# Patient Record
Sex: Male | Born: 1980
Health system: Southern US, Community
[De-identification: ages and names within clinical notes are randomized; demographics above are authoritative.]

## PROBLEM LIST (undated history)

## (undated) DIAGNOSIS — M5136 Other intervertebral disc degeneration, lumbar region: Secondary | ICD-10-CM

## (undated) DIAGNOSIS — F419 Anxiety disorder, unspecified: Secondary | ICD-10-CM

## (undated) DIAGNOSIS — M51369 Other intervertebral disc degeneration, lumbar region without mention of lumbar back pain or lower extremity pain: Secondary | ICD-10-CM

## (undated) DIAGNOSIS — Z8489 Family history of other specified conditions: Secondary | ICD-10-CM

## (undated) DIAGNOSIS — K219 Gastro-esophageal reflux disease without esophagitis: Secondary | ICD-10-CM

## (undated) DIAGNOSIS — F32A Depression, unspecified: Secondary | ICD-10-CM

## (undated) DIAGNOSIS — K635 Polyp of colon: Secondary | ICD-10-CM

## (undated) DIAGNOSIS — K5900 Constipation, unspecified: Secondary | ICD-10-CM

## (undated) HISTORY — PX: NO PAST SURGERIES: SHX2092

---

## 2009-09-12 ENCOUNTER — Ambulatory Visit: Payer: Self-pay | Admitting: General Practice

## 2011-01-18 ENCOUNTER — Ambulatory Visit: Payer: Self-pay | Admitting: Internal Medicine

## 2013-04-13 ENCOUNTER — Ambulatory Visit: Payer: Self-pay | Admitting: General Practice

## 2014-04-11 ENCOUNTER — Ambulatory Visit: Payer: Self-pay | Admitting: Physician Assistant

## 2014-04-11 LAB — URINALYSIS, COMPLETE
BACTERIA: NEGATIVE
BILIRUBIN, UR: NEGATIVE
BLOOD: NEGATIVE
Glucose,UR: NEGATIVE mg/dL (ref 0–75)
Ketone: NEGATIVE
Leukocyte Esterase: NEGATIVE
Nitrite: NEGATIVE
Ph: 6 (ref 4.5–8.0)
Protein: NEGATIVE
RBC,UR: NONE SEEN /HPF (ref 0–5)
SPECIFIC GRAVITY: 1.01 (ref 1.003–1.030)
SQUAMOUS EPITHELIAL: NONE SEEN

## 2015-02-15 ENCOUNTER — Ambulatory Visit: Payer: Self-pay | Admitting: Physician Assistant

## 2015-03-21 ENCOUNTER — Other Ambulatory Visit
Admit: 2015-03-21 | Disposition: A | Discharge status: Home / Self Care | Payer: Self-pay | Attending: Ophthalmology | Admitting: Ophthalmology

## 2015-03-24 LAB — AEROBIC CULTURE

## 2015-04-11 LAB — CULTURE, FUNGUS WITHOUT SMEAR

## 2015-05-10 ENCOUNTER — Other Ambulatory Visit: Payer: Self-pay | Admitting: Family Medicine

## 2015-05-10 DIAGNOSIS — F17210 Nicotine dependence, cigarettes, uncomplicated: Secondary | ICD-10-CM

## 2015-05-10 DIAGNOSIS — H538 Other visual disturbances: Secondary | ICD-10-CM

## 2015-05-10 DIAGNOSIS — Z0000 Encounter for general adult medical examination without abnormal findings: Secondary | ICD-10-CM

## 2016-05-10 ENCOUNTER — Other Ambulatory Visit (HOSPITAL_COMMUNITY): Payer: Self-pay | Admitting: Emergency Medicine

## 2017-11-17 ENCOUNTER — Emergency Department: Payer: Self-pay

## 2017-11-17 ENCOUNTER — Encounter: Payer: Self-pay | Admitting: Emergency Medicine

## 2017-11-17 ENCOUNTER — Other Ambulatory Visit: Payer: Self-pay

## 2017-11-17 ENCOUNTER — Emergency Department
Admission: EM | Admit: 2017-11-17 | Discharge: 2017-11-17 | Disposition: A | Discharge status: Home / Self Care | Payer: Self-pay | Attending: Student in an Organized Health Care Education/Training Program | Admitting: Student in an Organized Health Care Education/Training Program

## 2017-11-17 DIAGNOSIS — F17210 Nicotine dependence, cigarettes, uncomplicated: Secondary | ICD-10-CM | POA: Insufficient documentation

## 2017-11-17 DIAGNOSIS — K219 Gastro-esophageal reflux disease without esophagitis: Secondary | ICD-10-CM | POA: Insufficient documentation

## 2017-11-17 DIAGNOSIS — R079 Chest pain, unspecified: Secondary | ICD-10-CM | POA: Insufficient documentation

## 2017-11-17 LAB — BASIC METABOLIC PANEL
Anion gap: 13 (ref 5–15)
BUN: 11 mg/dL (ref 6–20)
CALCIUM: 9.6 mg/dL (ref 8.9–10.3)
CO2: 24 mmol/L (ref 22–32)
Chloride: 100 mmol/L — ABNORMAL LOW (ref 101–111)
Creatinine, Ser: 0.98 mg/dL (ref 0.61–1.24)
GFR calc Af Amer: >60 mL/min (ref >60)
GLUCOSE: 87 mg/dL (ref 65–99)
Potassium: 4.1 mmol/L (ref 3.5–5.1)
Sodium: 137 mmol/L (ref 135–145)

## 2017-11-17 LAB — CBC
HCT: 47 % (ref 40.0–52.0)
HEMOGLOBIN: 15.9 g/dL (ref 13.0–18.0)
MCH: 30.4 pg (ref 26.0–34.0)
MCHC: 33.9 g/dL (ref 32.0–36.0)
MCV: 89.9 fL (ref 80.0–100.0)
Platelets: 207 10*3/uL (ref 150–440)
RBC: 5.23 MIL/uL (ref 4.40–5.90)
RDW: 12.8 % (ref 11.5–14.5)
WBC: 6.3 10*3/uL (ref 3.8–10.6)

## 2017-11-17 LAB — TROPONIN I

## 2017-11-17 MED ORDER — PANTOPRAZOLE SODIUM 40 MG PO TBEC: 40.0000 mg | DELAYED_RELEASE_TABLET | Freq: Every day | ORAL | 0 refills | Status: DC

## 2017-11-17 MED ORDER — PROMETHAZINE HCL 12.5 MG PO TABS: 12.5000 mg | ORAL_TABLET | Freq: Four times a day (QID) | ORAL | 0 refills | Status: DC | PRN

## 2017-11-17 NOTE — ED Notes (Signed)
Pt states over last several months he has experienced indigestion, and has been taking Nexium, with some relief initially. Patient also states he feels like he has recent lost weight bc he has been unable to eat. Pt denies any nausea or vomiting, just feels like when he tries to eat after a couple bites he just cant swallow. Pt currently reporting chest pain and a numbness and tingling in left arm. Pain in arm starts behind shoulder blade and travels down arm to behind ring finger. Pt currently in no obvious distress.

## 2017-11-17 NOTE — ED Provider Notes (Signed)
[facility] Emergency Department Provider Note    First MD Initiated Contact with Patient 11/17/17 1330     (approximate)  I have reviewed the triage vital signs and the nursing notes.   HISTORY  Chief Complaint Chest Pain    HPI Alex Mayer is a 36 y.o. male presents with chief complaint of 2 months of epigastric discomfort and burning in his chest now complaining of weight loss, persistent nausea and decreased oral intake with episodes of chest pain for the past 2 months with intermittent pain and tingling radiating down his left arm.  Patient has not had any recent hospitalizations.  He does smoke cigarettes but has decreased this.  He is tried limiting gluten as well as dairy.  Thinks that he has noted some improvement after gluten removal.  He has been taking Nexium with some improvement in his symptoms.  States that he is not having full bowel movements but is still passing gas.  States that he did get some improvement with the gas by taking Gas-X.  Denies any cough.  No shortness of breath.  No fevers.  No family history of inflammatory bowel disease.  No recent antibiotics.  No recent foreign travel.  No blood in his stools.  History reviewed. No pertinent past medical history. History reviewed. No pertinent family history. History reviewed. No pertinent surgical history. There are no active problems to display for this patient.     Prior to Admission medications   Medication Sig Start Date End Date Taking? Authorizing Provider  pantoprazole (PROTONIX) 40 MG tablet Take 1 tablet (40 mg total) by mouth daily. 11/17/17   Merlyn Lot, MD  promethazine (PHENERGAN) 12.5 MG tablet Take 1 tablet (12.5 mg total) by mouth every 6 (six) hours as needed for nausea or vomiting. 11/17/17   Merlyn Lot, MD    Allergies Patient has no known allergies.    Social History Social History   Tobacco Use  . Smoking status: Current Every Day  Smoker  . Smokeless tobacco: Never Used  Substance Use Topics  . Alcohol use: Not on file  . Drug use: Yes    Types: Marijuana    Review of Systems Patient denies headaches, rhinorrhea, blurry vision, numbness, shortness of breath, chest pain, edema, cough, abdominal pain, nausea, vomiting, diarrhea, dysuria, fevers, rashes or hallucinations unless otherwise stated above in HPI. ____________________________________________   PHYSICAL EXAM:  VITAL SIGNS: Vitals:   11/17/17 1132  BP: 115/71  Pulse: 89  Resp: 20  Temp: 97.7 F (36.5 C)  SpO2: 100%    Constitutional: Alert and oriented. Well appearing and in no acute distress. Eyes: Conjunctivae are normal.  Head: Atraumatic. Nose: No congestion/rhinnorhea. Mouth/Throat: Mucous membranes are moist.   Neck: No stridor. Painless ROM.  Cardiovascular: Normal rate, regular rhythm. Grossly normal heart sounds.  Good peripheral circulation. Respiratory: Normal respiratory effort.  No retractions. Lungs CTAB. Gastrointestinal: Soft and nontender. No distention. No abdominal bruits. No CVA tenderness. Genitourinary:  Musculoskeletal: No lower extremity tenderness nor edema.  No joint effusions. Neurologic:  Normal speech and language. No gross focal neurologic deficits are appreciated. No facial droop Skin:  Skin is warm, dry and intact. No rash noted. Psychiatric: Mood and affect are normal. Speech and behavior are normal.  ____________________________________________   LABS (all labs ordered are listed, but only abnormal results are displayed)  Results for orders placed or performed during the hospital encounter of 11/17/17 (from the past 24 hour(s))  Basic metabolic panel  Status: Abnormal   Collection Time: 11/17/17 11:30 AM  Result Value Ref Range   Sodium 137 135 - 145 mmol/L   Potassium 4.1 3.5 - 5.1 mmol/L   Chloride 100 (L) 101 - 111 mmol/L   CO2 24 22 - 32 mmol/L   Glucose, Bld 87 65 - 99 mg/dL   BUN 11 6 - 20  mg/dL   Creatinine, Ser 0.98 0.61 - 1.24 mg/dL   Calcium 9.6 8.9 - 10.3 mg/dL   GFR calc non Af Amer >60 >60 mL/min   GFR calc Af Amer >60 >60 mL/min   Anion gap 13 5 - 15  CBC     Status: None   Collection Time: 11/17/17 11:30 AM  Result Value Ref Range   WBC 6.3 3.8 - 10.6 K/uL   RBC 5.23 4.40 - 5.90 MIL/uL   Hemoglobin 15.9 13.0 - 18.0 g/dL   HCT 47.0 40.0 - 52.0 %   MCV 89.9 80.0 - 100.0 fL   MCH 30.4 26.0 - 34.0 pg   MCHC 33.9 32.0 - 36.0 g/dL   RDW 12.8 11.5 - 14.5 %   Platelets 207 150 - 440 K/uL  Troponin I     Status: None   Collection Time: 11/17/17 11:30 AM  Result Value Ref Range   Troponin I <0.03 <0.03 ng/mL   ____________________________________________  EKG My review and personal interpretation at Time: 11:31   Indication: chest pain  Rate: 80  Rhythm: sinus Axis: normal Other: non specific st changes, no stemi, normal intervals ____________________________________________  RADIOLOGY  I personally reviewed all radiographic images ordered to evaluate for the above acute complaints and reviewed radiology reports and findings.  These findings were personally discussed with the patient.  Please see medical record for radiology report.  ____________________________________________   PROCEDURES  Procedure(s) performed:  Procedures    Critical Care performed: no ____________________________________________   INITIAL IMPRESSION / ASSESSMENT AND PLAN / ED COURSE  Pertinent labs & imaging results that were available during my care of the patient were reviewed by me and considered in my medical decision making (see chart for details).  DDX: ACS, pericarditis, esophagitis, boerhaaves, pe, dissection, pna, bronchitis, costochondritis   Alex Mayer is a 36 y.o. who presents to the ED with 2 months of symptoms as described above.  He is afebrile and hemodynamically stable.  Abdominal exam is soft and benign.  He is in no respiratory distress.  Is  not clinically consistent with ACS.  EKG and troponin are negative.  Patient is low risk heart score of 2.  Not clinically consistent with pulmonary embolism.  Low risk by Wells score and is PERC negative.  No evidence of pneumonia.  Blood work is reassuring.  The patient's presentation and duration of symptoms do not feel that emergent CT imaging is clinically indicated.  Do suspect there is some component of gastritis, possibly gluten intolerance.  Will start patient on an antacid medication as well as anti-emetics.  Patient will be given referral to gastroenterology.  Have discussed with the patient and available family all diagnostics and treatments performed thus far and all questions were answered to the best of my ability. The patient demonstrates understanding and agreement with plan.       ____________________________________________   FINAL CLINICAL IMPRESSION(S) / ED DIAGNOSES  Final diagnoses:  Chest pain, unspecified type  Gastroesophageal reflux disease, esophagitis presence not specified      NEW MEDICATIONS STARTED DURING THIS VISIT:  This SmartLink is deprecated.  Use AVSMEDLIST instead to display the medication list for a patient.   Note:  This document was prepared using Dragon voice recognition software and may include unintentional dictation errors.    Merlyn Lot, MD 11/17/17 [phone]

## 2017-11-17 NOTE — Discharge Instructions (Signed)

## 2017-11-24 ENCOUNTER — Encounter: Payer: Self-pay | Admitting: Emergency Medicine

## 2017-11-24 ENCOUNTER — Emergency Department
Admission: EM | Admit: 2017-11-24 | Discharge: 2017-11-24 | Disposition: A | Discharge status: Home / Self Care | Payer: Self-pay | Attending: Emergency Medicine | Admitting: Emergency Medicine

## 2017-11-24 ENCOUNTER — Other Ambulatory Visit: Payer: Self-pay

## 2017-11-24 DIAGNOSIS — R61 Generalized hyperhidrosis: Secondary | ICD-10-CM | POA: Insufficient documentation

## 2017-11-24 DIAGNOSIS — R42 Dizziness and giddiness: Secondary | ICD-10-CM | POA: Insufficient documentation

## 2017-11-24 DIAGNOSIS — R55 Syncope and collapse: Secondary | ICD-10-CM | POA: Insufficient documentation

## 2017-11-24 DIAGNOSIS — F17200 Nicotine dependence, unspecified, uncomplicated: Secondary | ICD-10-CM | POA: Insufficient documentation

## 2017-11-24 DIAGNOSIS — Z79899 Other long term (current) drug therapy: Secondary | ICD-10-CM | POA: Insufficient documentation

## 2017-11-24 HISTORY — DX: Gastro-esophageal reflux disease without esophagitis: K21.9

## 2017-11-24 LAB — CBC
HCT: 43.9 % (ref 40.0–52.0)
Hemoglobin: 14.7 g/dL (ref 13.0–18.0)
MCH: 30.2 pg (ref 26.0–34.0)
MCHC: 33.6 g/dL (ref 32.0–36.0)
MCV: 90 fL (ref 80.0–100.0)
PLATELETS: 175 10*3/uL (ref 150–440)
RBC: 4.88 MIL/uL (ref 4.40–5.90)
RDW: 12.8 % (ref 11.5–14.5)
WBC: 4.9 10*3/uL (ref 3.8–10.6)

## 2017-11-24 LAB — COMPREHENSIVE METABOLIC PANEL
ALBUMIN: 4.7 g/dL (ref 3.5–5.0)
ALT: 14 U/L — AB (ref 17–63)
AST: 23 U/L (ref 15–41)
Alkaline Phosphatase: 58 U/L (ref 38–126)
Anion gap: 7 (ref 5–15)
BUN: 14 mg/dL (ref 6–20)
CHLORIDE: 102 mmol/L (ref 101–111)
CO2: 27 mmol/L (ref 22–32)
CREATININE: 0.93 mg/dL (ref 0.61–1.24)
Calcium: 9.4 mg/dL (ref 8.9–10.3)
GFR calc Af Amer: >60 mL/min (ref >60)
GLUCOSE: 148 mg/dL — AB (ref 65–99)
Potassium: 3.3 mmol/L — ABNORMAL LOW (ref 3.5–5.1)
SODIUM: 136 mmol/L (ref 135–145)
Total Bilirubin: 0.8 mg/dL (ref 0.3–1.2)
Total Protein: 7.6 g/dL (ref 6.5–8.1)

## 2017-11-24 LAB — GLUCOSE, CAPILLARY: Glucose-Capillary: 136 mg/dL — ABNORMAL HIGH (ref 65–99)

## 2017-11-24 LAB — LIPASE, BLOOD: LIPASE: 49 U/L (ref 11–51)

## 2017-11-24 MED ORDER — SUCRALFATE 1 G PO TABS: 1.0000 g | ORAL_TABLET | Freq: Three times a day (TID) | ORAL | 0 refills | Status: DC

## 2017-11-24 NOTE — ED Triage Notes (Signed)
sayswas atwork and he got shakey, diaphoretic and felt like passing out.skin warm and drynow. Awake and alert.ekgand fsbs done.sayshe has been  Having abd pain as well for weeks.

## 2017-11-24 NOTE — ED Notes (Signed)
Pt ambulatory to POV with wife. Steady gait. NAD, VSS, Resp non-labored and equal. No questions or concerns voiced during discharge.

## 2017-11-24 NOTE — ED Provider Notes (Signed)
[facility] Emergency Department Provider Note   ____________________________________________    I have reviewed the triage vital signs and the nursing notes.   HISTORY  Chief Complaint Near Syncope     HPI Alex Mayer is a 36 y.o. male who presents after a near syncopal episode while at work today.  Patient reports he had been on his feet for quite a while and began to feel lightheaded.  He decided to lean over on a machine because he felt that he might faint.  He report he broke out in sweats and became quite lightheaded and had tunnel vision.  His coworkers helped him down and he began to feel better soon thereafter.  He does report over the past month that he has had difficulty with what sounds like severe acid reflux and he has been adjusting his diet to cope with this.  He has an appointment with gastroenterology next week.  He denies chest pain, palpitations, shortness of breath.  No recent travel.  No calf pain or swelling.   Past Medical History:  Diagnosis Date  . GERD (gastroesophageal reflux disease)     There are no active problems to display for this patient.   History reviewed. No pertinent surgical history.  Prior to Admission medications   Medication Sig Start Date End Date Taking? Authorizing Provider  pantoprazole (PROTONIX) 40 MG tablet Take 1 tablet (40 mg total) by mouth daily. 11/17/17   Merlyn Lot, MD  promethazine (PHENERGAN) 12.5 MG tablet Take 1 tablet (12.5 mg total) by mouth every 6 (six) hours as needed for nausea or vomiting. 11/17/17   Merlyn Lot, MD  sucralfate (CARAFATE) 1 g tablet Take 1 tablet (1 g total) by mouth 4 (four) times daily -  with meals and at bedtime for 20 days. 11/24/17 12/14/17  Lavonia Drafts, MD     Allergies Patient has no known allergies.  No family history on file.  Social History Social History   Tobacco Use  . Smoking status: Current Every Day Smoker  .  Smokeless tobacco: Never Used  Substance Use Topics  . Alcohol use: Not on file  . Drug use: Yes    Types: Marijuana    Review of Systems  Constitutional: No fever/chills Eyes: No visual changes.  ENT: No sore throat. Cardiovascular: Denies chest pain. Respiratory: Denies shortness of breath. Gastrointestinal:   No nausea, no vomiting.   Genitourinary: Negative for dysuria. Musculoskeletal: Negative for back pain. Skin: Negative for rash. Neurological: Negative for headaches or weakness   ____________________________________________   PHYSICAL EXAM:  VITAL SIGNS: ED Triage Vitals  Enc Vitals Group     BP 11/24/17 1028 (!) 123/59     Pulse Rate 11/24/17 1028 93     Resp 11/24/17 1028 16     Temp 11/24/17 1040 (!) 97.5 F (36.4 C)     Temp Source 11/24/17 1040 Oral     SpO2 11/24/17 1028 99 %     Weight 11/24/17 1028 77.1 kg (170 lb)     Height 11/24/17 1028 1.829 m (6')     Head Circumference --      Peak Flow --      Pain Score 11/24/17 1026 2     Pain Loc --      Pain Edu? --      Excl. in [place]? --     Constitutional: Alert and oriented. No acute distress. Pleasant and interactive Eyes: Conjunctivae are normal.   Nose:  No congestion/rhinnorhea. Mouth/Throat: Mucous membranes are moist.    Cardiovascular: Normal rate, regular rhythm. Grossly normal heart sounds.  Good peripheral circulation. Respiratory: Normal respiratory effort.  No retractions. Lungs CTAB. Gastrointestinal: Soft and nontender. No distention.  No CVA tenderness. Genitourinary: deferred Musculoskeletal: No lower extremity tenderness nor edema.  Warm and well perfused Neurologic:  Normal speech and language. No gross focal neurologic deficits are appreciated.  Skin:  Skin is warm, dry and intact. No rash noted. Psychiatric: Mood and affect are normal. Speech and behavior are normal.  ____________________________________________   LABS (all labs ordered are listed, but only abnormal  results are displayed)  Labs Reviewed  COMPREHENSIVE METABOLIC PANEL - Abnormal; Notable for the following components:      Result Value   Potassium 3.3 (*)    Glucose, Bld 148 (*)    ALT 14 (*)    All other components within normal limits  GLUCOSE, CAPILLARY - Abnormal; Notable for the following components:   Glucose-Capillary 136 (*)    All other components within normal limits  LIPASE, BLOOD  CBC   ____________________________________________  EKG  ED ECG REPORT I, Lavonia Drafts, the attending physician, personally viewed and interpreted this ECG.  Date: 11/24/2017  Rhythm: normal sinus rhythm QRS Axis: normal Intervals: normal ST/T Wave abnormalities: normal Narrative Interpretation: no evidence of acute ischemia  ____________________________________________  RADIOLOGY  None ____________________________________________   PROCEDURES  Procedure(s) performed: No  Procedures   Critical Care performed: No ____________________________________________   INITIAL IMPRESSION / ASSESSMENT AND PLAN / ED COURSE  Pertinent labs & imaging results that were available during my care of the patient were reviewed by me and considered in my medical decision making (see chart for details).  Patient well-appearing in no acute distress.  Exam is reassuring.  Lab work is unremarkable.  EKG is normal.  Suspect vasovagal syncope as no evidence of ACS, no history of arrhythmia, denies drugs or substance abuse  I have asked him to follow-up with his PCP as well as his gastroenterologist for further evaluation, return to the ED if any worsening symptoms    ____________________________________________   FINAL CLINICAL IMPRESSION(S) / ED DIAGNOSES  Final diagnoses:  Near syncope        Note:  This document was prepared using Dragon voice recognition software and may include unintentional dictation errors.    Lavonia Drafts, MD 11/24/17 [phone]

## 2017-11-28 ENCOUNTER — Telehealth: Payer: Self-pay | Admitting: Gastroenterology

## 2017-11-28 NOTE — Telephone Encounter (Signed)
Patient called and stated he is having abdominal pain. I explained no doctors are in the office until Wednesday, which he has an appt. Told him he may want to go to the ED. He said they always tell him it's reflux and to go home.

## 2017-12-03 ENCOUNTER — Encounter: Payer: Self-pay | Admitting: Gastroenterology

## 2017-12-03 ENCOUNTER — Ambulatory Visit (INDEPENDENT_AMBULATORY_CARE_PROVIDER_SITE_OTHER): Payer: Self-pay | Admitting: Gastroenterology

## 2017-12-03 ENCOUNTER — Other Ambulatory Visit
Admission: RE | Admit: 2017-12-03 | Discharge: 2017-12-03 | Disposition: A | Discharge status: Home / Self Care | Payer: Self-pay | Source: Ambulatory Visit | Attending: Gastroenterology | Admitting: Gastroenterology

## 2017-12-03 ENCOUNTER — Telehealth: Payer: Self-pay

## 2017-12-03 VITALS — BP 112/75 | HR 71 | Temp 97.9°F | Ht 72.0 in | Wt 149.8 lb

## 2017-12-03 DIAGNOSIS — R079 Chest pain, unspecified: Secondary | ICD-10-CM

## 2017-12-03 DIAGNOSIS — R634 Abnormal weight loss: Secondary | ICD-10-CM

## 2017-12-03 DIAGNOSIS — R1013 Epigastric pain: Secondary | ICD-10-CM | POA: Insufficient documentation

## 2017-12-03 DIAGNOSIS — R194 Change in bowel habit: Secondary | ICD-10-CM

## 2017-12-03 LAB — TSH: TSH: 1.312 u[IU]/mL (ref 0.350–4.500)

## 2017-12-03 LAB — C-REACTIVE PROTEIN: CRP: <0.8 mg/dL (ref <1.0)

## 2017-12-03 NOTE — Patient Instructions (Signed)
During your visit today with Dr. Vicente Males he has advised the following:  1. Go to [facility] located in [facility] for blood work. 2. Nurse to schedule you for CT of Abd and Pelvis for weight loss and abd. Pain 3. Referral to cardiologist to evaluate chest pain 4. Begin Linzess 130mcg samples have been provided for 2 weeks. 5. Please contact office to let us know if these samples are working so we can send prescription. 6. Follow up with Dr. Vicente Males in 6 weeks.  Thank you. Sharyn Lull CMA

## 2017-12-03 NOTE — Addendum Note (Signed)
Addended by: Vanetta Mulders on: 12/03/2017 09:06 AM   Modules accepted: Orders

## 2017-12-03 NOTE — Progress Notes (Signed)
Jonathon Bellows MD, MRCP(U.K) [address]  [place]  [place],  [address]  Main: [phone]  Fax: [phone]   Gastroenterology Consultation  Referring Provider:    ER Dr Corky Downs  Primary Care Physician:  Patient, No Pcp Per Primary Gastroenterologist:  Dr. Jonathon Bellows  Reason for Consultation:     Abdominal pain         HPI:   Alex Mayer is a 36 y.o. y/o male.He has been referred for abdominal pain.  He was seen at the ER on 11/17/2017 with a complaint of 2 months of epigastric discomfort going into his chest, weight loss, nausea.  In the ER he had an EKG and troponins which are negative.  He was treated for possible gastritis possible gluten intolerance.  He was subsequently referred to gastroenterology and is here to see me today.  Labs on 11/24/2017 revealed normal CBC, a CMP with a mildly elevated glucose of 148 and a normal lipase troponins done on 11/17/2017 were negative.  No prior abdominal imaging.  Abdominal pain: Onset: began before thanksgiving and gradually getting worse , was continious and when he started Nexium for 2 weeks and felt a bit better , once he completed the course and stopped the symptoms returned and chest pain was so bad that he thought he was having a heart attack, commenced on protonix, started taking it and took it for a week ,felt a bit better but not completely and almost fainted at night , no palpitations, no shortness of breath, felt he was going to pass out. Felt better gradually over that day and had to be seen at the ER, he was told he had a syncopal episode. He was also told he may have ulcers, commneced on carafate and with PPI which has helped but not completeed.  Site :abdomen , sometimes chest , tingling in his arm , none in the jaw, each episode lasts as long as stomach is empty and at night  Radiation: localized  Petra Kuba of pain: sharp  Aggravating factors: laying ,  Relieving factors :food helps , only been eating [place]  sandwiches  Weight loss: 15 lbs over a month  NSAID use: none  PPI use :as above  Gall bladder surgery: intact  Frequency of bowel movements: never had a regular bowel movement , usually every few days, not very hard  Change in bowel movements: yes- was constipated previously- once he started protonix- seems to have helped him go more regularly and soft  Relief with bowel movements: yes  Gas/Bloating/Abdominal distension: yes.     smokes -cigarettes which he has almost quit , occasional marijuana use. He says he commenced on CBD oil a week prior to onset of symptoms.   Past Medical History:  Diagnosis Date  . GERD (gastroesophageal reflux disease)     History reviewed. No pertinent surgical history.  Prior to Admission medications   Medication Sig Start Date End Date Taking? Authorizing Provider  pantoprazole (PROTONIX) 40 MG tablet Take 1 tablet (40 mg total) by mouth daily. 11/17/17  Yes Merlyn Lot, MD  sucralfate (CARAFATE) 1 g tablet Take 1 tablet (1 g total) by mouth 4 (four) times daily -  with meals and at bedtime for 20 days. 11/24/17 12/14/17 Yes Lavonia Drafts, MD    History reviewed. No pertinent family history.   Social History   Tobacco Use  . Smoking status: Current Every Day Smoker  . Smokeless tobacco: Never Used  Substance Use Topics  .  Alcohol use: Not on file  . Drug use: Yes    Types: Marijuana    Allergies as of 12/03/2017  . (No Known Allergies)    Review of Systems:    All systems reviewed and negative except where noted in HPI.   Physical Exam:  BP 112/75   Pulse 71   Temp 97.9 F (36.6 C)   Ht 6' (1.829 m)   Wt 149 lb 12.8 oz (67.9 kg)   BMI 20.32 kg/m  No LMP for male patient. Psych:  Alert and cooperative. Normal mood and affect. General:   Alert,  Well-developed, well-nourished, pleasant and cooperative in NAD Head:  Normocephalic and atraumatic. Eyes:  Sclera clear, no icterus.   Conjunctiva pink. Ears:  Normal auditory  acuity. Nose:  No deformity, discharge, or lesions. Mouth:  No deformity or lesions,oropharynx pink & moist. Neck:  Supple; no masses or thyromegaly. Lungs:  Respirations even and unlabored.  Clear throughout to auscultation.   No wheezes, crackles, or rhonchi. No acute distress. Heart:  Regular rate and rhythm; no murmurs, clicks, rubs, or gallops. Abdomen:  Normal bowel sounds.  No bruits.  Soft, non-tender and non-distended without masses, hepatosplenomegaly or hernias noted.  No guarding or rebound tenderness.    Msk:  Symmetrical without gross deformities. Good, equal movement & strength bilaterally. Pulses:  Normal pulses noted. Extremities:  No clubbing or edema.  No cyanosis. Neurologic:  Alert and oriented x3;  grossly normal neurologically. Skin:  Intact without significant lesions or rashes. No jaundice. Lymph Nodes:  No significant cervical adenopathy. Psych:  Alert and cooperative. Normal mood and affect.  Imaging Studies: Dg Chest 2 View  Result Date: 11/17/2017 CLINICAL DATA:  Mid chest pain, shortness of breath and weakness for 1 month. EXAM: CHEST  2 VIEW COMPARISON:  04/13/2013 FINDINGS: The cardiac silhouette, mediastinal and hilar contours are normal and stable. The lungs demonstrate mild hyperinflation. No acute pulmonary findings. No pleural effusion. The bony thorax is intact. IMPRESSION: Mild hyperinflation but no acute pulmonary findings. Electronically Signed   By: Marijo Sanes M.D.   On: 11/17/2017 12:08    Assessment and Plan:   Alex Mayer is a 36 y.o. y/o male has been referred for abdominal pain , chest pain, change in bowel habits. Loss of weight . His chest discomfort is nopt typical of angina but he did have a syncopal episode and hence I feel needs to be evaluated. His GI symptoms could be from a gastric ulcer vs effects of cannaboid compounds , could also be from IBS-C.    1. Cardiology referral to evaluate chest discomfort and syncopal  episode recently 2. Weight loss likely to poor intake due to recent symptoms 3. Stop marijuana and CBD oil  4. Continue Protonix and carafate. 5. EGD+colonoscopy after cardiac clearance 6. Stool H pylori antigen  7. CT scan of the abdomen and pelvis with contrast 8. Trial of Linzess 145 mcg 2 weeks samples provided.    I have discussed alternative options, risks & benefits,  which include, but are not limited to, bleeding, infection, perforation,respiratory complication & drug reaction.  The patient agrees with this plan & written consent will be obtained.     Follow up in 8 weeks   Dr Jonathon Bellows MD,MRCP(U.K)

## 2017-12-03 NOTE — Telephone Encounter (Signed)
Patient has been informed of his appt for CT of the abd and pelvis scheduled for 12/15/17 at [facility] at 3:30 he has been asked to stop by the radiology desk to pick up contrast and nothing to eat or drink 4 hours prior to CT.  Thanks [employer]

## 2017-12-04 ENCOUNTER — Telehealth: Payer: Self-pay

## 2017-12-04 LAB — GLIADIN ANTIBODIES, SERUM
Antigliadin Abs, IgA: 11 units (ref 0–19)
Gliadin IgG: 3 units (ref 0–19)

## 2017-12-04 LAB — TISSUE TRANSGLUTAMINASE, IGA

## 2017-12-04 NOTE — Telephone Encounter (Signed)
Ivin Booty at Dr. Donivan Scull office has informed me that they are moving patients appt up to January 3rd 3:40pm. They will contact him with new appt.  Thanks [employer]

## 2017-12-04 NOTE — Telephone Encounter (Signed)
Alex Mayer from the GI clinic calling about referral New patient is scheduled 1/30 with Dr [facility] clinic states he needs an appt much sooner Patient will need colonoscopy due to severe weight loss but cannot proceed without patient seeing cardiologist first  Please call Alex Mayer to discuss

## 2017-12-04 NOTE — Telephone Encounter (Signed)
Notified Michelle at [facility] that pt is scheduled 12/11/17, 3:40pm with Dr. Rockey Situ.  Notified patient who is agreeable. Provided directions and phone number.

## 2017-12-04 NOTE — Telephone Encounter (Signed)
[facility] contacted pt with appt however they are not able to get to him scheduled until the end of the month.  I've contacted the office to explain that patient needs to have the appt sooner than the end of the month due to 20 pound weight loss and after he is evaluated by the cardiologist we can proceed with his EGD and Colonoscopy.  Pt took his Linzess this am for the first time he stated about an hour later he experienced diarrhea.  He is not sure if its due to a build up but wanted to inform you.  Also he will be taking his stool sample in this am.  Thanks Sharyn Lull

## 2017-12-05 LAB — RETICULIN ANTIBODIES, IGA W TITER: RETICULIN AB, IGA: NEGATIVE {titer}

## 2017-12-06 LAB — H. PYLORI ANTIGEN, STOOL: H. PYLORI STOOL AG, EIA: NEGATIVE

## 2017-12-10 ENCOUNTER — Telehealth: Payer: Self-pay

## 2017-12-10 NOTE — Telephone Encounter (Signed)
Advised patient of results per Dr. Vicente Males.    -      H pylori stool antigen negative, celiac serology negative

## 2017-12-11 ENCOUNTER — Encounter: Payer: Self-pay | Admitting: Cardiovascular Disease

## 2017-12-11 ENCOUNTER — Ambulatory Visit: Payer: BLUE CROSS/BLUE SHIELD | Admitting: Cardiovascular Disease

## 2017-12-11 VITALS — BP 102/60 | HR 62 | Ht 72.0 in | Wt 150.5 lb

## 2017-12-11 DIAGNOSIS — R202 Paresthesia of skin: Secondary | ICD-10-CM

## 2017-12-11 DIAGNOSIS — K2100 Gastro-esophageal reflux disease with esophagitis, without bleeding: Secondary | ICD-10-CM

## 2017-12-11 DIAGNOSIS — Z8249 Family history of ischemic heart disease and other diseases of the circulatory system: Secondary | ICD-10-CM

## 2017-12-11 DIAGNOSIS — M79602 Pain in left arm: Secondary | ICD-10-CM

## 2017-12-11 DIAGNOSIS — R200 Anesthesia of skin: Secondary | ICD-10-CM | POA: Insufficient documentation

## 2017-12-11 DIAGNOSIS — K219 Gastro-esophageal reflux disease without esophagitis: Secondary | ICD-10-CM | POA: Insufficient documentation

## 2017-12-11 DIAGNOSIS — R1013 Epigastric pain: Secondary | ICD-10-CM | POA: Diagnosis not present

## 2017-12-11 DIAGNOSIS — R109 Unspecified abdominal pain: Secondary | ICD-10-CM | POA: Insufficient documentation

## 2017-12-11 DIAGNOSIS — R634 Abnormal weight loss: Secondary | ICD-10-CM | POA: Diagnosis not present

## 2017-12-11 NOTE — Patient Instructions (Signed)
Medication Instructions:   No medication changes made  Labwork:  No new labs needed  Testing/Procedures:  We will order a CT coronary calcium score for left arm pain, family hx $150  Follow-Up: It was a pleasure seeing you in the office today. Please call us if you have new issues that need to be addressed before your next appt.  [phone]  Your physician wants you to follow-up in:  As needed  If you need a refill on your cardiac medications before your next appointment, please call your pharmacy.

## 2017-12-11 NOTE — Progress Notes (Signed)
Cardiology Office Note  Date:  12/11/2017   ID:  Alex Mayer, DOB Jan 18, 1981, MRN [id number]  PCP:  Patient, No Pcp Per   Chief Complaint  Patient presents with  . Other    New patient. ED follow up for chest pain, left arm tingling, losing 20 in a month. Patient states he has not been eating. Patient needs clearance for EGD and colonoscopy. Meds reviewed verbally with patient.     HPI:  Alex Mayer is a 37 year old gentleman with past medical history of Smoking Recently seen in the emergency room for chest pain, left arm tingling, weight loss Who presents by referral from the emergency room for evaluation of his chest pain arm pain  Reports having dramatic weight loss after Thanksgiving 2018 Everything he ate would cause significant stomach pain Was eating 1 meal per day Had "pass out spells" Presented to the emergency room after symptoms got worse, 11/17/2017 A point started on PPI Reports having 20 pound weight loss. The past several months  Again evaluated emergency room 11/24/2017, started on Carafate scheduled for CT scans of his abdomen per the patient  Slowly getting better "I know there is ulcers" Now eating several meals per day with very mild symptoms of chest discomfort   he is still concerned about the Electrical shock down left arm, seems to come and go, presenting at rest  Cold chills and burning in the arms Flutters At times Denies any chest pain on exertion  Currently eating crackers, smoothies, [place] sand  Smoked 25 years, drank mountain dew before  Father with CAD, smoker Grandfather with CAD, smoker  EKG personally reviewed by myself on todays visit Shows normal sinus rhythm rate 62 bpm no significant ST or T-wave changes   PMH:   has a past medical history of GERD (gastroesophageal reflux disease).  PSH:   History reviewed. No pertinent surgical history.  Current Outpatient Medications  Medication Sig Dispense Refill  . Linaclotide  (LINZESS PO) Take by mouth.    . pantoprazole (PROTONIX) 40 MG tablet Take 1 tablet (40 mg total) by mouth daily. 30 tablet 0  . sucralfate (CARAFATE) 1 g tablet Take 1 tablet (1 g total) by mouth 4 (four) times daily -  with meals and at bedtime for 20 days. 80 tablet 0   No current facility-administered medications for this visit.      Allergies:   Patient has no known allergies.   Social History:  The patient  reports that he has quit smoking. he has never used smokeless tobacco. He reports that he uses drugs. Drug: Marijuana. He reports that he does not drink alcohol.   Family History:   family history is not on file.    Review of Systems: Review of Systems  Constitutional: Negative.   Respiratory: Negative.   Cardiovascular: Negative.   Gastrointestinal: Positive for abdominal pain.  Musculoskeletal: Negative.   Neurological: Negative.        Left arm tingling   Psychiatric/Behavioral: Negative.   All other systems reviewed and are negative.    PHYSICAL EXAM: VS:  BP 102/60 (BP Location: Left Arm, Patient Position: Sitting, Cuff Size: Normal)   Pulse 62   Ht 6' (1.829 m)   Wt 150 lb 8 oz (68.3 kg)   BMI 20.41 kg/m  , BMI Body mass index is 20.41 kg/m. GEN:  in no acute distress , Thin  HEENT: normal  Neck: no JVD, carotid bruits, or masses Cardiac: RRR; no murmurs, rubs,  or gallops,no edema  Respiratory:  clear to auscultation bilaterally, normal work of breathing GI: soft, nontender, nondistended, + BS MS: no deformity or atrophy  Skin: warm and dry, no rash Neuro:  Strength and sensation are intact Psych: euthymic mood, full affect    Recent Labs: 11/24/2017: ALT 14; BUN 14; Creatinine, Ser 0.93; Hemoglobin 14.7; Platelets 175; Potassium 3.3; Sodium 136 12/03/2017: TSH 1.312    Lipid Panel No results found for: CHOL, HDL, LDLCALC, TRIG    Wt Readings from Last 3 Encounters:  12/11/17 150 lb 8 oz (68.3 kg)  12/03/17 149 lb 12.8 oz (67.9 kg)   11/24/17 170 lb (77.1 kg)       ASSESSMENT AND PLAN:  Left arm tingling - Plan: CT CARDIAC SCORING Atypical in nature, likely secondary to dramatic weight loss 20 pounds Presenting at rest No further workup needed  Family history of coronary artery disease Long discussion with him concerning premature coronary disease father and grandfather He is interested in screening study for premature coronary disease Ordered CT coronary calcium scoring  Weight loss Severe GERD, unable to exclude ulcers Seen by GI Symptoms improving on PPI and Carafate Now eating several meals per day with mild symptoms  Epigastric pain As above, symptoms improving  Recommended he stay on PPI and Carafate  Gastroesophageal reflux disease with esophagitis   Total encounter time more than 45 minutes  Greater than 50% was spent in counseling and coordination of care with the patient    Disposition:   F/U  Procedures  . CT CARDIAC SCORING     Signed, Esmond Plants, M.D., Ph.D. 12/11/2017  [place], [place]

## 2017-12-12 ENCOUNTER — Telehealth: Payer: Self-pay | Admitting: *Deleted

## 2017-12-12 ENCOUNTER — Other Ambulatory Visit: Payer: Self-pay

## 2017-12-12 ENCOUNTER — Telehealth: Payer: Self-pay | Admitting: Gastroenterology

## 2017-12-12 MED ORDER — PANTOPRAZOLE SODIUM 40 MG PO TBEC: 40.0000 mg | DELAYED_RELEASE_TABLET | Freq: Every day | ORAL | 2 refills | Status: DC

## 2017-12-12 NOTE — Telephone Encounter (Signed)
Patient sees Dr.Anna and on his last visit he was told that he could prescribe him Protonix 40mg . Patient would like to get that medication sent in to [facility] on [address]

## 2017-12-15 ENCOUNTER — Ambulatory Visit: Payer: BLUE CROSS/BLUE SHIELD

## 2017-12-15 ENCOUNTER — Ambulatory Visit: Payer: Self-pay

## 2017-12-18 NOTE — Addendum Note (Signed)
Addended by: Janan Ridge on: 12/18/2017 04:23 PM   Modules accepted: Orders

## 2017-12-22 ENCOUNTER — Telehealth: Payer: Self-pay | Admitting: Gastroenterology

## 2017-12-22 NOTE — Telephone Encounter (Signed)
Patient called in & l/m on machine.He is ready to schedule radiology,colonoscopy & endoscopy procedures. He currently has an appointment with Dr Vicente Males on 01-15-18.

## 2017-12-23 ENCOUNTER — Other Ambulatory Visit: Payer: Self-pay

## 2017-12-23 ENCOUNTER — Telehealth: Payer: Self-pay | Admitting: *Deleted

## 2017-12-23 DIAGNOSIS — R1013 Epigastric pain: Secondary | ICD-10-CM

## 2017-12-23 DIAGNOSIS — R634 Abnormal weight loss: Secondary | ICD-10-CM

## 2017-12-23 NOTE — Telephone Encounter (Signed)
   [facility] Pre-operative Risk Assessment    Request for surgical clearance:  1. What type of surgery is being performed? Colonoscopy   2. When is this surgery scheduled? TBD  3. Are there any medications that need to be held prior to surgery and how long? n/a   4. Practice name and name of physician performing surgery? [facility]   5. What is your office phone and fax number? [phone]; [phone] - fax   6. Anesthesia type (None, local, MAC, general) ? GENERAL   Roney Jaffe 12/23/2017, 2:38 PM  _________________________________________________________________

## 2017-12-23 NOTE — Telephone Encounter (Signed)
Received 2nd request

## 2017-12-24 NOTE — Telephone Encounter (Signed)
Acceptable risk for colonoscopy,  No further cardiac testing needed

## 2017-12-24 NOTE — Telephone Encounter (Signed)
Clearance routed to physician.

## 2017-12-25 NOTE — Telephone Encounter (Signed)
cardaic clearance given

## 2017-12-26 ENCOUNTER — Other Ambulatory Visit: Payer: Self-pay

## 2017-12-26 MED ORDER — PEG 3350-KCL-NA BICARB-NACL 420 G PO SOLR: 4000.0000 mL | Freq: Once | ORAL | 0 refills | Status: AC

## 2017-12-30 ENCOUNTER — Ambulatory Visit
Admission: RE | Admit: 2017-12-30 | Discharge: 2017-12-30 | Disposition: A | Discharge status: Home / Self Care | Payer: BLUE CROSS/BLUE SHIELD | Source: Ambulatory Visit | Attending: Gastroenterology | Admitting: Gastroenterology

## 2017-12-30 ENCOUNTER — Ambulatory Visit: Payer: BLUE CROSS/BLUE SHIELD | Admitting: Anesthesiology

## 2017-12-30 ENCOUNTER — Encounter: Payer: Self-pay | Admitting: *Deleted

## 2017-12-30 ENCOUNTER — Encounter
Admission: RE | Disposition: A | Discharge status: Home / Self Care | Payer: Self-pay | Source: Ambulatory Visit | Attending: Gastroenterology

## 2017-12-30 DIAGNOSIS — R109 Unspecified abdominal pain: Secondary | ICD-10-CM | POA: Diagnosis present

## 2017-12-30 DIAGNOSIS — D125 Benign neoplasm of sigmoid colon: Secondary | ICD-10-CM

## 2017-12-30 DIAGNOSIS — R1013 Epigastric pain: Secondary | ICD-10-CM | POA: Diagnosis not present

## 2017-12-30 DIAGNOSIS — R634 Abnormal weight loss: Secondary | ICD-10-CM | POA: Diagnosis not present

## 2017-12-30 DIAGNOSIS — K319 Disease of stomach and duodenum, unspecified: Secondary | ICD-10-CM | POA: Insufficient documentation

## 2017-12-30 DIAGNOSIS — K219 Gastro-esophageal reflux disease without esophagitis: Secondary | ICD-10-CM | POA: Diagnosis not present

## 2017-12-30 DIAGNOSIS — F17210 Nicotine dependence, cigarettes, uncomplicated: Secondary | ICD-10-CM | POA: Diagnosis not present

## 2017-12-30 DIAGNOSIS — Z79899 Other long term (current) drug therapy: Secondary | ICD-10-CM | POA: Diagnosis not present

## 2017-12-30 HISTORY — PX: COLONOSCOPY WITH PROPOFOL: SHX5780

## 2017-12-30 HISTORY — PX: ESOPHAGOGASTRODUODENOSCOPY (EGD) WITH PROPOFOL: SHX5813

## 2017-12-30 SURGERY — ESOPHAGOGASTRODUODENOSCOPY (EGD) WITH PROPOFOL
Anesthesia: General

## 2017-12-30 MED ORDER — PROPOFOL 500 MG/50ML IV EMUL
INTRAVENOUS | Status: DC | PRN
  Administered 2017-12-30: 150 ug/kg/min via INTRAVENOUS

## 2017-12-30 MED ORDER — SODIUM CHLORIDE 0.9 % IV SOLN
INTRAVENOUS | Status: DC | PRN
  Administered 2017-12-30: 12:00:00 via INTRAVENOUS

## 2017-12-30 MED ORDER — LIDOCAINE HCL (CARDIAC) 20 MG/ML IV SOLN
INTRAVENOUS | Status: DC | PRN
  Administered 2017-12-30: 50 mg via INTRAVENOUS

## 2017-12-30 MED ORDER — LINACLOTIDE 290 MCG PO CAPS: 290.0000 ug | ORAL_CAPSULE | Freq: Every day | ORAL | 1 refills | Status: DC

## 2017-12-30 MED ORDER — SODIUM CHLORIDE 0.9 % IV SOLN
INTRAVENOUS | Status: DC
  Administered 2017-12-30: 1000 mL via INTRAVENOUS

## 2017-12-30 MED ORDER — PROPOFOL 500 MG/50ML IV EMUL
INTRAVENOUS | Status: AC
  Filled 2017-12-30: qty 50

## 2017-12-30 MED ORDER — PROPOFOL 10 MG/ML IV BOLUS
INTRAVENOUS | Status: DC | PRN
  Administered 2017-12-30: 60 mg via INTRAVENOUS
  Administered 2017-12-30: 10 mg via INTRAVENOUS
  Administered 2017-12-30: 30 mg via INTRAVENOUS

## 2017-12-30 NOTE — Anesthesia Preprocedure Evaluation (Signed)
Anesthesia Evaluation  Patient identified by MRN, date of birth, ID band Patient awake    Reviewed: Allergy & Precautions, NPO status , Patient's Chart, lab work & pertinent test results  History of Anesthesia Complications Negative for: history of anesthetic complications  Airway Mallampati: I  TM Distance: >3 FB Neck ROM: Full    Dental no notable dental hx.    Pulmonary neg sleep apnea, neg COPD, Current Smoker,    breath sounds clear to auscultation- rhonchi (-) wheezing      Cardiovascular Exercise Tolerance: Good (-) hypertension(-) CAD and (-) Past MI  Rhythm:Regular Rate:Normal - Systolic murmurs and - Diastolic murmurs    Neuro/Psych negative neurological ROS  negative psych ROS   GI/Hepatic Neg liver ROS, GERD  ,  Endo/Other  negative endocrine ROSneg diabetes  Renal/GU negative Renal ROS     Musculoskeletal negative musculoskeletal ROS (+)   Abdominal (+) - obese,   Peds  Hematology negative hematology ROS (+)   Anesthesia Other Findings Past Medical History: No date: GERD (gastroesophageal reflux disease)   Reproductive/Obstetrics                             Anesthesia Physical Anesthesia Plan  ASA: II  Anesthesia Plan: General   Post-op Pain Management:    Induction: Intravenous  PONV Risk Score and Plan: 0 and Propofol infusion  Airway Management Planned: Natural Airway  Additional Equipment:   Intra-op Plan:   Post-operative Plan:   Informed Consent: I have reviewed the patients History and Physical, chart, labs and discussed the procedure including the risks, benefits and alternatives for the proposed anesthesia with the patient or authorized representative who has indicated his/her understanding and acceptance.   Dental advisory given  Plan Discussed with: CRNA and Anesthesiologist  Anesthesia Plan Comments:         Anesthesia Quick  Evaluation

## 2017-12-30 NOTE — Op Note (Signed)
[facility] Patient Name: Alex Mayer Procedure Date: 12/30/2017 11:41 AM MRN: [id number] Account #: [id number] Date of Birth: 07/24/1981 Admit Type: Outpatient Age: 37 Room: [facility] ENDO ROOM 1 Gender: Male Note Status: Finalized Procedure:            Upper GI endoscopy Indications:          Abdominal pain Providers:            Jonathon Bellows MD, MD Referring MD:         No Local Md, MD (Referring MD) Medicines:            Monitored Anesthesia Care Complications:        No immediate complications. Procedure:            Pre-Anesthesia Assessment:                       - Prior to the procedure, a History and Physical was                        performed, and patient medications, allergies and                        sensitivities were reviewed. The patient's tolerance of                        previous anesthesia was reviewed.                       - The risks and benefits of the procedure and the                        sedation options and risks were discussed with the                        patient. All questions were answered and informed                        consent was obtained.                       - ASA Grade Assessment: II - A patient with mild                        systemic disease.                       After obtaining informed consent, the endoscope was                        passed under direct vision. Throughout the procedure,                        the patient's blood pressure, pulse, and oxygen                        saturations were monitored continuously. The Endoscope                        was introduced through the mouth, and advanced to the  third part of duodenum. The upper GI endoscopy was                        accomplished with ease. The patient tolerated the                        procedure well. Findings:      The examined duodenum was normal.      The esophagus was normal.      The entire  examined stomach was normal. Biopsies were taken with a cold       forceps for histology.      The cardia and gastric fundus were normal on retroflexion. Impression:           - Normal examined duodenum.                       - Normal esophagus.                       - Normal stomach. Biopsied. Recommendation:       - Await pathology results.                       - Perform a colonoscopy today. Procedure Code(s):    --- Professional ---                       [phone], Esophagogastroduodenoscopy, flexible, transoral;                        with biopsy, single or multiple Diagnosis Code(s):    --- Professional ---                       R10.9, Unspecified abdominal pain CPT copyright 2016 American Medical Association. All rights reserved. The codes documented in this report are preliminary and upon coder review may  be revised to meet current compliance requirements. Jonathon Bellows, MD Jonathon Bellows MD, MD 12/30/2017 11:53:59 AM This report has been signed electronically. Number of Addenda: 0 Note Initiated On: 12/30/2017 11:41 AM      [facility]

## 2017-12-30 NOTE — H&P (Signed)
           Jonathon Bellows, MD [address] [place], [place], [place], [address] [address], [place], [place], [place], [address] Phone: [phone]  Fax: [phone]  Primary Care Physician:  Patient, No Pcp Per   Pre-Procedure History & Physical: HPI:  Alex Mayer is a 37 y.o. male is here for an endoscopy and colonoscopy    Past Medical History:  Diagnosis Date  . GERD (gastroesophageal reflux disease)     Past Surgical History:  Procedure Laterality Date  . NO PAST SURGERIES      Prior to Admission medications   Medication Sig Start Date End Date Taking? Authorizing Provider  linaclotide Rolan Lipa) 290 MCG CAPS capsule Take 1 capsule (290 mcg total) by mouth daily before breakfast. 12/30/17 02/27/18  Jonathon Bellows, MD  pantoprazole (PROTONIX) 40 MG tablet Take 1 tablet (40 mg total) by mouth daily. 12/12/17   Jonathon Bellows, MD  sucralfate (CARAFATE) 1 g tablet Take 1 tablet (1 g total) by mouth 4 (four) times daily -  with meals and at bedtime for 20 days. 11/24/17 12/14/17  Lavonia Drafts, MD    Allergies as of 12/23/2017  . (No Known Allergies)    History reviewed. No pertinent family history.  Social History   Socioeconomic History  . Marital status: Married    Spouse name: Not on file  . Number of children: Not on file  . Years of education: Not on file  . Highest education level: Not on file  Social Needs  . Financial resource strain: Not on file  . Food insecurity - worry: Not on file  . Food insecurity - inability: Not on file  . Transportation needs - medical: Not on file  . Transportation needs - non-medical: Not on file  Occupational History  . Not on file  Tobacco Use  . Smoking status: Current Every Day Smoker    Types: Cigarettes  . Smokeless tobacco: Never Used  . Tobacco comment: Recently quit smoking.   Substance and Sexual Activity  . Alcohol use: No    Frequency: Never  . Drug use: Yes    Types: Marijuana  . Sexual  activity: Not on file  Other Topics Concern  . Not on file  Social History Narrative  . Not on file    Review of Systems: See HPI, otherwise negative ROS  Physical Exam: BP (!) 103/91   Pulse 64   Temp 97.6 F (36.4 C)   Resp 16   Ht 6' (1.829 m)   Wt 150 lb (68 kg)   SpO2 100%   BMI 20.34 kg/m  General:   Alert,  pleasant and cooperative in NAD Head:  Normocephalic and atraumatic. Neck:  Supple; no masses or thyromegaly. Lungs:  Clear throughout to auscultation, normal respiratory effort.    Heart:  +S1, +S2, Regular rate and rhythm, No edema. Abdomen:  Soft, nontender and nondistended. Normal bowel sounds, without guarding, and without rebound.   Neurologic:  Alert and  oriented x4;  grossly normal neurologically.  Impression/Plan: Alex Mayer is here for an endoscopy and colonoscopy  to be performed for  evaluation of unintentional weight loss, abdominal pain     Risks, benefits, limitations, and alternatives regarding endoscopy have been reviewed with the patient.  Questions have been answered.  All parties agreeable.   Jonathon Bellows, MD  12/30/2017, 3:54 PM

## 2017-12-30 NOTE — Anesthesia Post-op Follow-up Note (Signed)
Anesthesia QCDR form completed.        

## 2017-12-30 NOTE — Transfer of Care (Signed)
Immediate Anesthesia Transfer of Care Note  Patient: Alex Mayer  Procedure(s) Performed: ESOPHAGOGASTRODUODENOSCOPY (EGD) WITH PROPOFOL (N/A ) COLONOSCOPY WITH PROPOFOL (N/A )  Patient Location: Endoscopy Unit  Anesthesia Type:General  Level of Consciousness: sedated  Airway & Oxygen Therapy: Patient Spontanous Breathing and Patient connected to nasal cannula oxygen  Post-op Assessment: Report given to RN and Post -op Vital signs reviewed and stable  Post vital signs: Reviewed and stable  Last Vitals:  Vitals:   12/30/17 1054 12/30/17 1214  BP: 112/68 (!) (P) 86/56  Pulse: 82   Resp: (!) 24   Temp: (!) 36.3 C (P) 36.4 C  SpO2: 100%     Last Pain:  Vitals:   12/30/17 1054  TempSrc: Tympanic         Complications: No apparent anesthesia complications

## 2017-12-30 NOTE — Anesthesia Postprocedure Evaluation (Signed)
Anesthesia Post Note  Patient: Alex Mayer  Procedure(s) Performed: ESOPHAGOGASTRODUODENOSCOPY (EGD) WITH PROPOFOL (N/A ) COLONOSCOPY WITH PROPOFOL (N/A )  Patient location during evaluation: Endoscopy Anesthesia Type: General Level of consciousness: awake and alert and oriented Pain management: pain level controlled Vital Signs Assessment: post-procedure vital signs reviewed and stable Respiratory status: spontaneous breathing, nonlabored ventilation and respiratory function stable Cardiovascular status: blood pressure returned to baseline and stable Postop Assessment: no signs of nausea or vomiting Anesthetic complications: no     Last Vitals:  Vitals:   12/30/17 1244 12/30/17 1253  BP: 109/77 (!) 103/91  Pulse: (!) 57 62  Resp: 10 16  Temp:    SpO2: 100% 100%    Last Pain:  Vitals:   12/30/17 1253  TempSrc:   PainSc: 0-No pain                 Walter Min

## 2017-12-30 NOTE — Op Note (Signed)
[facility] Patient Name: Alex Mayer Procedure Date: 12/30/2017 11:42 AM MRN: [id number] Account #: [id number] Date of Birth: Mar 28, 1981 Admit Type: Outpatient Age: 37 Room: [facility] ENDO ROOM 1 Gender: Male Note Status: Finalized Procedure:            Colonoscopy Indications:          Abdominal pain Providers:            Jonathon Bellows MD, MD Referring MD:         No Local Md, MD (Referring MD) Medicines:            Monitored Anesthesia Care Complications:        No immediate complications. Procedure:            Pre-Anesthesia Assessment:                       - Prior to the procedure, a History and Physical was                        performed, and patient medications, allergies and                        sensitivities were reviewed. The patient's tolerance of                        previous anesthesia was reviewed.                       - The risks and benefits of the procedure and the                        sedation options and risks were discussed with the                        patient. All questions were answered and informed                        consent was obtained.                       - ASA Grade Assessment: II - A patient with mild                        systemic disease.                       After obtaining informed consent, the colonoscope was                        passed under direct vision. Throughout the procedure,                        the patient's blood pressure, pulse, and oxygen                        saturations were monitored continuously. The                        Colonoscope was introduced through the anus and                        advanced  to the the cecum, identified by the                        appendiceal orifice, IC valve and transillumination.                        The colonoscopy was performed with ease. The patient                        tolerated the procedure well. The quality of the bowel             preparation was good. Findings:      The perianal and digital rectal examinations were normal.      A 8 mm polyp was found in the sigmoid colon. The polyp was       semi-pedunculated. The polyp was removed with a hot snare. Resection and       retrieval were complete.      The exam was otherwise without abnormality on direct and retroflexion       views. Impression:           - One 8 mm polyp in the sigmoid colon, removed with a                        hot snare. Resected and retrieved.                       - The examination was otherwise normal on direct and                        retroflexion views. Recommendation:       - Discharge patient to home (with escort).                       - Resume previous diet.                       - Continue present medications.                       - Await pathology results.                       - Repeat colonoscopy [day] for surveillance based on                        pathology results.                       - Return to my office in 4 weeks.                       - Increase dose of linzess to 290 mcg once daily-script                        will be sent to pharmacy Procedure Code(s):    --- Professional ---                       [phone], Colonoscopy, flexible; with removal of tumor(s),                        polyp(s), or  other lesion(s) by snare technique Diagnosis Code(s):    --- Professional ---                       D12.5, Benign neoplasm of sigmoid colon                       R10.9, Unspecified abdominal pain CPT copyright 2016 American Medical Association. All rights reserved. The codes documented in this report are preliminary and upon coder review may  be revised to meet current compliance requirements. Jonathon Bellows, MD Jonathon Bellows MD, MD 12/30/2017 12:11:19 PM This report has been signed electronically. Number of Addenda: 0 Note Initiated On: 12/30/2017 11:42 AM Scope Withdrawal Time: 0 hours 11 minutes 27 seconds  Total  Procedure Duration: 0 hours 12 minutes 36 seconds       [facility]

## 2017-12-31 ENCOUNTER — Encounter: Payer: Self-pay | Admitting: Gastroenterology

## 2017-12-31 ENCOUNTER — Telehealth: Payer: Self-pay | Admitting: Gastroenterology

## 2017-12-31 NOTE — Telephone Encounter (Signed)
Patient LVM for Linzess is way too expensive needs generic or cheaper.

## 2018-01-01 LAB — SURGICAL PATHOLOGY

## 2018-01-02 ENCOUNTER — Other Ambulatory Visit: Payer: Self-pay

## 2018-01-02 DIAGNOSIS — K5904 Chronic idiopathic constipation: Secondary | ICD-10-CM

## 2018-01-02 MED ORDER — LUBIPROSTONE 24 MCG PO CAPS: 24.0000 ug | ORAL_CAPSULE | Freq: Two times a day (BID) | ORAL | 2 refills | Status: DC

## 2018-01-02 MED ORDER — LACTULOSE 20 G PO PACK: 20.0000 g | PACK | Freq: Three times a day (TID) | ORAL | 2 refills | Status: DC

## 2018-01-07 ENCOUNTER — Other Ambulatory Visit: Payer: Self-pay

## 2018-01-07 ENCOUNTER — Ambulatory Visit: Payer: Self-pay | Admitting: Internal Medicine

## 2018-01-07 MED ORDER — LINACLOTIDE 290 MCG PO CAPS: 290.0000 ug | ORAL_CAPSULE | Freq: Every day | ORAL | Status: DC

## 2018-01-09 ENCOUNTER — Ambulatory Visit (INDEPENDENT_AMBULATORY_CARE_PROVIDER_SITE_OTHER)
Admission: RE | Admit: 2018-01-09 | Discharge: 2018-01-09 | Disposition: A | Discharge status: Home / Self Care | Payer: Self-pay | Source: Ambulatory Visit | Attending: Cardiovascular Disease | Admitting: Cardiovascular Disease

## 2018-01-09 DIAGNOSIS — M79602 Pain in left arm: Secondary | ICD-10-CM

## 2018-01-15 ENCOUNTER — Other Ambulatory Visit: Payer: Self-pay

## 2018-01-15 ENCOUNTER — Ambulatory Visit: Payer: 59 | Admitting: Gastroenterology

## 2018-01-15 ENCOUNTER — Encounter: Payer: Self-pay | Admitting: Gastroenterology

## 2018-01-15 VITALS — BP 114/73 | HR 71 | Temp 97.9°F | Ht 72.0 in | Wt 145.8 lb

## 2018-01-15 DIAGNOSIS — Z86010 Personal history of colonic polyps: Secondary | ICD-10-CM

## 2018-01-15 DIAGNOSIS — K5900 Constipation, unspecified: Secondary | ICD-10-CM | POA: Diagnosis not present

## 2018-01-15 DIAGNOSIS — R634 Abnormal weight loss: Secondary | ICD-10-CM

## 2018-01-15 DIAGNOSIS — K219 Gastro-esophageal reflux disease without esophagitis: Secondary | ICD-10-CM

## 2018-01-15 MED ORDER — LINACLOTIDE 145 MCG PO CAPS: 145.0000 ug | ORAL_CAPSULE | Freq: Every day | ORAL | 1 refills | Status: DC

## 2018-01-15 MED ORDER — SUCRALFATE 1 GM/10ML PO SUSP: 1.0000 g | Freq: Four times a day (QID) | ORAL | 1 refills | Status: DC

## 2018-01-15 MED ORDER — PANTOPRAZOLE SODIUM 40 MG PO TBEC: 40.0000 mg | DELAYED_RELEASE_TABLET | Freq: Every day | ORAL | 2 refills | Status: DC

## 2018-01-15 NOTE — Progress Notes (Signed)
Jonathon Bellows MD, MRCP(U.K) [address]  [place]  [place], [place] [address]  Main: [phone]  Fax: [phone]   Primary Care Physician: Patient, No Pcp Per  Primary Gastroenterologist:  Dr. Jonathon Bellows   Chief Complaint  Patient presents with  . Heartburn  . Back Pain  . Chest Soreness    HPI: Alex Mayer is a 37 y.o. male   Summary of history : He was initially seen on 12/03/2017 for abdominal pain.  He was referred to me by the emergency room as he was seen at the ER on 11/17/2017 with a complaint of 2 months of epigastric discomfort going into his chest, weight loss, nausea.  In the ER he had an EKG and troponins which are negative.  He was treated for possible gastritis possible gluten intolerance.  He was subsequently referred to gastroenterology.  She has had abdominal pain which began before Thanksgiving and gradually getting worse.  Started Nexium for 2 weeks and felt a bit better.  Once the Nexium was stopped the symptoms to return back with the addition of chest pain which is so bad that he would talk about having a heart attack.  Recommencement of Protonix with it for a week felt better but would not course and as he has felt dizzy.  He was also commenced on Carafate which kind of helped but not completely.  Troponins were done at the ER which were negative.  The pain is in his abdomen sometimes it is just tingling in his arm but none in his jaw episode last as long the stomach is empty and at night.  Nonradiating sharp in nature worse when laying down and relieved by intake of food.  He has lost 15 pounds over 1 month.  He denies any NSAID use.  He has a bowel movement every few days not very hard.  He does have relief of pain after bowel movements and has a lot of gas bloating and abdominal distention.  At his last visit he was utilizing marijuana and CBD oil which I did suggest him to stop as it could cause his symptoms.   Interval history   12/03/2017  - 01/15/2018  12/03/2017: H. pylori stool antigen was negative.  CRP negative celiac serology negative TSH normal.  CT scan of the abdomen and pelvis was ordered but not obtained.   12/30/2017: EGD: Normal study gastric biopsies showed mild reactive gastropathy negative for H. pylori.  A colonoscopy was also performed on the same day an 8 mm polyp was found in the sigmoid colon which was resected and results suggested to tubular adenoma.  He was evaluated by cardiology and a CT coronary calcium scoring test has been ordered by Dr. Rockey Situ on 12/11/2017.  For 10 days after the procedure felt a lot better- past 1 week the hearburn has returned. Taking Protonix 1 day , Says his weight is stable. Stopped all THC and CBD oil. Overall his abdominal pain is better. Past few days, feels he has itching over his chest and stomach. Carafate helped. Since procedures overall feels better. Has a bowel movement once a  Week  , past few days , had one every day, small in size. Feels full. Insurance did not pay for linzess, now has new insurance.     BP 114/73 (BP Location: Left Arm, Patient Position: Sitting, Cuff Size: Normal)   Pulse 71   Temp 97.9 F (36.6 C) (Oral)   Ht 6' (1.829 m)  Wt 145 lb 12.8 oz (66.1 kg)   BMI 19.77 kg/m    Current Outpatient Medications  Medication Sig Dispense Refill  . pantoprazole (PROTONIX) 40 MG tablet Take 1 tablet (40 mg total) by mouth daily. 30 tablet 2   No current facility-administered medications for this visit.     Allergies as of 01/15/2018  . (No Known Allergies)    ROS:  General: Negative for anorexia, weight loss, fever, chills, fatigue, weakness. ENT: Negative for hoarseness, difficulty swallowing , nasal congestion. CV: Negative for chest pain, angina, palpitations, dyspnea on exertion, peripheral edema.  Respiratory: Negative for dyspnea at rest, dyspnea on exertion, cough, sputum, wheezing.  GI: See history of present illness. GU:  Negative for  dysuria, hematuria, urinary incontinence, urinary frequency, nocturnal urination.  Endo: Negative for unusual weight change.    Physical Examination:   BP 114/73 (BP Location: Left Arm, Patient Position: Sitting, Cuff Size: Normal)   Pulse 71   Temp 97.9 F (36.6 C) (Oral)   Ht 6' (1.829 m)   Wt 145 lb 12.8 oz (66.1 kg)   BMI 19.77 kg/m   General: Well-nourished, well-developed in no acute distress.  Eyes: No icterus. Conjunctivae pink. Mouth: Oropharyngeal mucosa moist and pink , no lesions erythema or exudate. Lungs: Clear to auscultation bilaterally. Non-labored. Heart: Regular rate and rhythm, no murmurs rubs or gallops.  Abdomen: Bowel sounds are normal, nontender, nondistended, no hepatosplenomegaly or masses, no abdominal bruits or hernia , no rebound or guarding.   Extremities: No lower extremity edema. No clubbing or deformities. Neuro: Alert and oriented x 3.  Grossly intact. Skin: Warm and dry, no jaundice.   Psych: Alert and cooperative, normal mood and affect.   Imaging Studies: Ct Cardiac Scoring  Addendum Date: 01/12/2018   ADDENDUM REPORT: 01/12/2018 08:53 CLINICAL DATA:  Risk stratification EXAM: Coronary Calcium Score TECHNIQUE: The patient was scanned on a [employer] scanner. Axial non-contrast 3 mm slices were carried out through the heart. The data set was analyzed on a dedicated work station and scored using the [place]. FINDINGS: Non-cardiac: See separate report from [facility]. Ascending Aorta: Normal size, no calcifications. Pericardium: Normal. Coronary arteries: Normal origin. IMPRESSION: Coronary calcium score of 0. This was 0 percentile for age and sex matched control. Electronically Signed   By: Ena Dawley   On: 01/12/2018 08:53   Result Date: 01/12/2018 EXAM: OVER-READ INTERPRETATION  CT CHEST The following report is an over-read performed by radiologist Dr. Vinnie Langton of [facility], [place] on 01/09/2018. This over-read  does not include interpretation of cardiac or coronary anatomy or pathology. The coronary calcium score interpretation by the cardiologist is attached. COMPARISON:  None. FINDINGS: Within the visualized portions of the thorax there are no suspicious appearing pulmonary nodules or masses, there is no acute consolidative airspace disease, no pleural effusions, no pneumothorax and no lymphadenopathy. Visualized portions of the upper abdomen are unremarkable. There are no aggressive appearing lytic or blastic lesions noted in the visualized portions of the skeleton. IMPRESSION: 1. No significant incidental noncardiac findings are noted. Electronically Signed: By: Vinnie Langton M.D. On: 01/09/2018 11:02    Assessment and Plan:   Alex Mayer is a 37 y.o. y/o male  has been referred for abdominal pain , chest pain, change in bowel habits. Loss of weight . Likely his symptoms are ca combination of GERD and constipation. He had significant improvement of symptoms after the clean out from his colonoscopy but had recurrence of symptoms gradually  after the procedure as his constipation returned. (linzess was denied by insurance , now has new insurance)   1.Weight loss likely to poor intake due to recent symptoms 2. Stop marijuana and CBD oil  3 Continue Protonix and carafate. 5.Repeat colonoscopy for surveillance in 5 years.  Consider genetic testing- he is interested- strong family history of colon polyps 6. CT scan of the abdomen and pelvis with contrast 7. Linzess 145 mcg 2 weeks samples provided and new script ordered    Dr Jonathon Bellows  MD,MRCP [facility]) Follow up in 6 weeks

## 2018-01-16 ENCOUNTER — Telehealth: Payer: Self-pay | Admitting: Genetic Counselor

## 2018-01-16 NOTE — Telephone Encounter (Addendum)
I spoke with Alex Mayer this morning and he chose to schedule genetic counseling on Thursday, 01/22/18 at noon.  ---------------------------------------------------------------------  Dr. Vicente Males is referring Alex Mayer for genetic counseling due to a personal history of colon polyp at a young age. I left him a message to call and schedule this telegenetics visit to be done by phone at his convenience.   Steele Berg, [place], [place] Genetic Counselor Phone: [phone]

## 2018-01-19 ENCOUNTER — Telehealth: Payer: Self-pay

## 2018-01-19 NOTE — Telephone Encounter (Signed)
LVM for patient callback for results per Dr. Vicente Males.   - gastric bx showed reactive gastropathy and he had one tubular adenoma- suggest repeat colonoscopy in 5 years. Suggest follow up in office per last note

## 2018-01-22 ENCOUNTER — Encounter: Payer: Self-pay | Admitting: Genetic Counselor

## 2018-01-22 ENCOUNTER — Telehealth: Payer: Self-pay

## 2018-01-22 ENCOUNTER — Telehealth: Payer: Self-pay | Admitting: Genetic Counselor

## 2018-01-22 NOTE — Telephone Encounter (Signed)
Advised patient of results per Dr. Vicente Males.    - gastric bx showed reactive gastropathy and he had one tubular adenoma- suggest repeat colonoscopy in 5 years. Suggest follow up in office per last note.

## 2018-01-22 NOTE — Telephone Encounter (Signed)
Cancer Genetics            Telegenetics Initial Visit    Patient Name: Alex Mayer Patient DOB: 1981-04-28 Patient Age: 37 y.o. Phone Call Date: 01/22/2018  Referring Provider: Jonathon Bellows, MD  Reason for Visit: Evaluate for hereditary susceptibility to cancer    Assessment and Plan:  . Alex Mayer's history personal history of a colon polyp at age 77 in combination with his father's history of polyps and possibly cancer warrants a genetics evaluation. We discussed the importance of obtaining more information from his father in order to have a more accurate risk assessment.  . Testing is recommended to determine whether he has a pathogenic mutation that will impact his screening and risk-reduction for cancer. A negative result will be reassuring.  . Alex Mayer did not wish to pursue genetic testing yet. He wanted to obtain information from his father as well as check the cost of testing given that he got new insurance this month and has not met his deductible yet. Testing can be coordinated when he is ready.   . Alex Mayer is encouraged to remain in contact with Cancer Genetics annually so that we can update the family history and inform him of any changes in cancer genetics and testing that may be of benefit for this family.    Dr. Grayland Ormond was available for questions concerning this case. Total time spent by counseling by phone was approximately 30 minutes.   _____________________________________________________________________   History of Present Illness: Alex Mayer, a 37 y.o. male, was referred for genetic counseling to discuss the possibility of a hereditary predisposition to colon polyps or cancer and discuss whether genetic testing is warranted. This was a telegenetics visit via phone.  Alex Mayer has no personal history of cancer. He was recently found to have an 76m tubular adenoma in his sigmoid colon.  Past Medical History:    Diagnosis Date  . GERD (gastroesophageal reflux disease)     Past Surgical History:  Procedure Laterality Date  . COLONOSCOPY WITH PROPOFOL N/A 12/30/2017   Procedure: COLONOSCOPY WITH PROPOFOL;  Surgeon: AJonathon Bellows MD;  [facility]  Service: Gastroenterology;  Laterality: N/A;  . ESOPHAGOGASTRODUODENOSCOPY (EGD) WITH PROPOFOL N/A 12/30/2017   Procedure: ESOPHAGOGASTRODUODENOSCOPY (EGD) WITH PROPOFOL;  Surgeon: AJonathon Bellows MD;  [facility];  Service: Gastroenterology;  Laterality: N/A;  . NO PAST SURGERIES      Family History: Significant diagnoses include the following:  Family History  Problem Relation Age of Onset  . Colon polyps Father        started in his 468s unsure of # or type.   . Cancer Father        unsure of type but reportedly received chemotherapy  . Stomach cancer Maternal Grandfather        dx 583s deceased 653s   Additionally, Mr. SMeidingerhas 2 sons (ages 165and 831 months. He has 2 brothers (ages 251and 356 who have not had colonoscopies. His mother (age 738 has not yet had a colonoscopy. She has 3 brothers and 6 sisters. His father (age 739 has an unclear medical history of possibly having had cancer as well as multiple polyps. His father has a sister and 3 brothers.  Alex Mayer is Caucasian - NOS. There is no known Jewish ancestry and no consanguinity.  Discussion: We reviewed the characteristics, features and inheritance patterns of hereditary  cancer syndromes. We discussed his risk of harboring a mutation in the context of his personal and family history. We discussed the process of genetic testing, insurance coverage and implications of results: positive, negative and variant of unknown significance (VUS).   Alex Mayer questions were answered to his satisfaction today and he is welcome to call with any additional questions or concerns. Thank you for the referral and allowing [place] to share in the care of your patient.     Steele Berg, MS, [place] Certified Genetic Counselor phone: [phone]

## 2018-01-23 ENCOUNTER — Ambulatory Visit: Admission: RE | Admit: 2018-01-23 | Payer: 59 | Source: Ambulatory Visit

## 2018-01-28 ENCOUNTER — Other Ambulatory Visit: Payer: Self-pay

## 2018-01-28 DIAGNOSIS — K5904 Chronic idiopathic constipation: Secondary | ICD-10-CM

## 2018-01-29 ENCOUNTER — Telehealth: Payer: Self-pay

## 2018-01-29 NOTE — Telephone Encounter (Signed)
Advised patient of change from CT to [place].   Same date 2/25 @ [place] 745AM NPO Midnight

## 2018-02-02 ENCOUNTER — Ambulatory Visit: Payer: 59

## 2018-02-06 ENCOUNTER — Telehealth: Payer: Self-pay

## 2018-02-06 NOTE — Telephone Encounter (Signed)
Advised patient that we needed a copy of his new insurance cards.   Patient states that he still has not received them but will email the information available to him.   Patient states that Linzess is helping but bowel movements are not solid and there is still a burning sensation.   Sent message to Dr. Vicente Males.

## 2018-02-10 ENCOUNTER — Telehealth: Payer: Self-pay | Admitting: Gastroenterology

## 2018-02-10 NOTE — Telephone Encounter (Signed)
Pt left vm to get refill on rx  Lesents he has  Been out for a  Week and hasnt had a bowlemovent for a week please call pt

## 2018-02-10 NOTE — Telephone Encounter (Signed)
Advised patient that Linzess samples are available for him.   Linzess Rx is too expensive for patient.   Advised of notes per Dr. Vicente Males:   Suggest sent script for linzess if needed and daily sitz bath at nigght , avoid excess rubbing with toilet paper, pat dry after cleaning up after hosing down with the shower post bowel movement to avoid inflammation of hemorroids, if still borthering him , 7 days of anusol supp

## 2018-02-26 ENCOUNTER — Encounter (INDEPENDENT_AMBULATORY_CARE_PROVIDER_SITE_OTHER): Payer: Self-pay

## 2018-02-26 ENCOUNTER — Encounter: Payer: Self-pay | Admitting: Gastroenterology

## 2018-02-26 ENCOUNTER — Ambulatory Visit: Payer: 59 | Admitting: Gastroenterology

## 2018-02-26 DIAGNOSIS — K5900 Constipation, unspecified: Secondary | ICD-10-CM | POA: Diagnosis not present

## 2018-02-26 NOTE — Progress Notes (Signed)
Jonathon Bellows MD, MRCP(U.K) [address]  [place]  [place], [place] [address]  Main: [phone]  Fax: [phone]   Primary Care Physician: Patient, No Pcp Per  Primary Gastroenterologist:  Dr. Jonathon Bellows   No chief complaint on file.   HPI: Alex Mayer is a 37 y.o. male    Summary of history :  He is here today for a follow up  He was initially seen on 12/03/2017 for abdominal pain.  He was referred to me by the emergency room as he was seen at the ER on 11/17/2017 with epigastric discomfort going into his chest, weight loss, nausea.  Started Nexium for 2 weeks and felt a bit better.  Once the Nexium was stopped the symptoms to return back with the addition of chest pain which is so bad that he would talk about having a heart attack.  Recommencement of Protonix with it for a week felt better but would not continue course and as he has felt dizzy.  He was also commenced on Carafate which kind of helped but not completely.H/o constipation and use of THC 12/03/2017: H. pylori stool antigen was negative.  CRP negative celiac serology negative TSH normal  12/30/2017: EGD: Normal study gastric biopsies showed mild reactive gastropathy negative for H. pylori.  A colonoscopy was also performed on the same day an 8 mm polyp was found in the sigmoid colon which was resected and results suggested to tubular adenoma.    Interval history 01/15/2018-02/07/18  Ct scan of the abdomen ordered but not done Gaining weight about 1 lb every week. Cut down on sodas. Feeling much , no abdominal pain. His insurance did not approve any drug for constipation. Our office tried multiple agents which were rejected.   When he takes linzess he feels great . While on Linzess "I feel normal"   Once he took 8 days of ;lizness has been doing well. Stopped THC and CBD oils 0     Current Outpatient Medications  Medication Sig Dispense Refill  . linaclotide (LINZESS) 145 MCG CAPS capsule  Take 1 capsule (145 mcg total) by mouth daily before breakfast. 89 capsule 1  . pantoprazole (PROTONIX) 40 MG tablet Take 1 tablet (40 mg total) by mouth daily. 90 tablet 2  . sucralfate (CARAFATE) 1 GM/10ML suspension Take 10 mLs (1 g total) by mouth 4 (four) times daily. 420 mL 1   No current facility-administered medications for this visit.     Allergies as of 02/26/2018  . (No Known Allergies)    ROS:  General: Negative for anorexia, weight loss, fever, chills, fatigue, weakness. ENT: Negative for hoarseness, difficulty swallowing , nasal congestion. CV: Negative for chest pain, angina, palpitations, dyspnea on exertion, peripheral edema.  Respiratory: Negative for dyspnea at rest, dyspnea on exertion, cough, sputum, wheezing.  GI: See history of present illness. GU:  Negative for dysuria, hematuria, urinary incontinence, urinary frequency, nocturnal urination.  Endo: Negative for unusual weight change.    Physical Examination:   There were no vitals taken for this visit.  General: Well-nourished, well-developed in no acute distress.  Eyes: No icterus. Conjunctivae pink. Mouth: Oropharyngeal mucosa moist and pink , no lesions erythema or exudate. Lungs: Clear to auscultation bilaterally. Non-labored. Heart: Regular rate and rhythm, no murmurs rubs or gallops.  Abdomen: Bowel sounds are normal, nontender, nondistended, no hepatosplenomegaly or masses, no abdominal bruits or hernia , no rebound or guarding.   Extremities: No lower extremity edema. No clubbing or deformities.  Neuro: Alert and oriented x 3.  Grossly intact. Skin: Warm and dry, no jaundice.   Psych: Alert and cooperative, normal mood and affect.   Imaging Studies: No results found.  Assessment and Plan:   Alex Mayer is a 37 y.o. y/o male here to follow up for  GERD and constipation. He has responded very well to [place] with resolution of abdominal pain but unfortunately his insurance does not  approve linzess or any of the other newer laxatives which he responds well to . He has also been gaining weight    1.Daily miralax. More samples of Linzess given for PRN use 2. Monitor weight at home and if looses weight needs toi return sooner for CT scan of the abdomen     Dr Jonathon Bellows  MD,MRCP [facility]) Follow up in 6 months

## 2018-08-31 ENCOUNTER — Encounter: Payer: Self-pay | Admitting: Gastroenterology

## 2018-08-31 ENCOUNTER — Ambulatory Visit: Payer: 59 | Admitting: Gastroenterology

## 2018-08-31 NOTE — Progress Notes (Deleted)
Summary of history :  He is here today for a follow up  He was initially seen on 12/03/2017 for abdominal pain. He was referred to me by the emergency room as he was seen at the ER on 11/17/2017 with epigastric discomfort going into his chest, weight loss, nausea. Started Nexium for 2 weeks and felt a bit better. Once the Nexium was stopped the symptoms to return back with the addition of chest pain which is so bad that he would talk about having a heart attack. Recommencement of Protonix with it for a week felt better but would not continue course and as he has felt dizzy. He was also commenced on Carafate which kind of helped but not completely.H/o constipation and use of THC 12/03/2017: H. pylori stool antigen was negative. CRP negative celiac serology negative TSH normal  12/30/2017: EGD: Normal study gastric biopsies showed mild reactive gastropathy negative for H. pylori. A colonoscopy was also performed on the same day an 8 mm polyp was found in the sigmoid colon which was resected and results suggested to tubular adenoma.   Interval history3/2/19-9/23/19  Ct scan of the abdomen ordered but not done Gaining weight about 1 lb every week. Cut down on sodas. Feeling much , no abdominal pain. His insurance did not approve any drug for constipation. Our office tried multiple agents which were rejected.   When he takes linzess he feels great . While on Linzess "I feel normal"    Stopped THC and CBD oils    Stevan Eberwein is a 37 y.o. y/o male here to follow up for  GERD and constipation. He has responded very well to [place] with resolution of abdominal pain but unfortunately his insurance does not approve linzess or any of the other newer laxatives which he responds well to . He has also been gaining weight    1.Daily miralax. More samples of Linzess given for PRN use 2. Monitor weight at home and if looses weight needs toi return sooner for CT scan of the abdomen

## 2018-11-04 IMAGING — CT CT HEART SCORING
2 series · 16 of 20 positions shown, 18 images · non-contrast
Comparison: None.

CLINICAL DATA: Risk stratification

EXAM:
Coronary Calcium Score
TECHNIQUE: The patient was scanned on a [employer] scanner. Axial
non-contrast 3 mm slices were carried out through the heart. The
data set was analyzed on a dedicated work station and scored using
the [place] method.

[Series 2: casc 3.0 i36f 2 bestdiast 68 % · axial · 0.33mm/px · z∈[-262,-146]mm · 8 of 51 slices shown, 10 images]
[im 6/51  vessel]
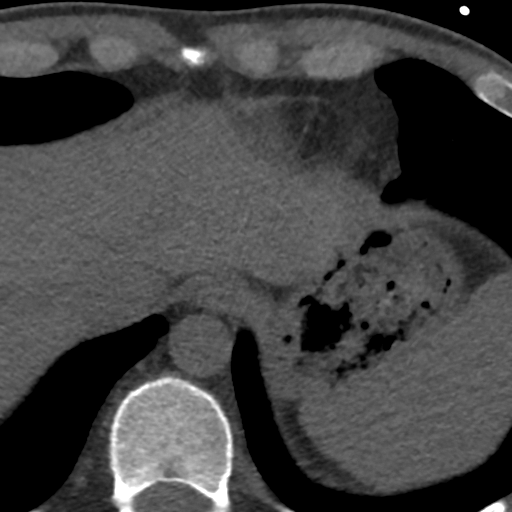
[im 6/51  lung]
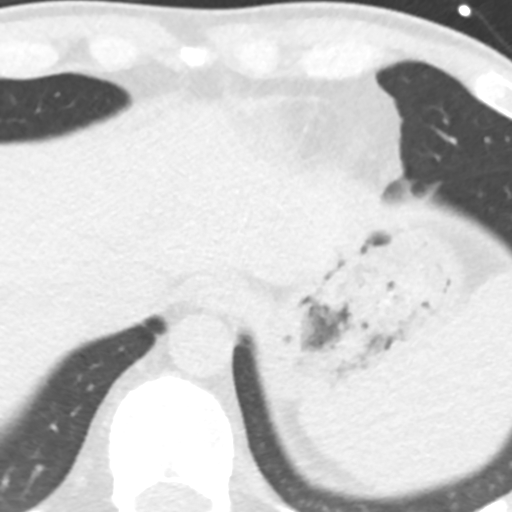
[im 12/51  vessel]
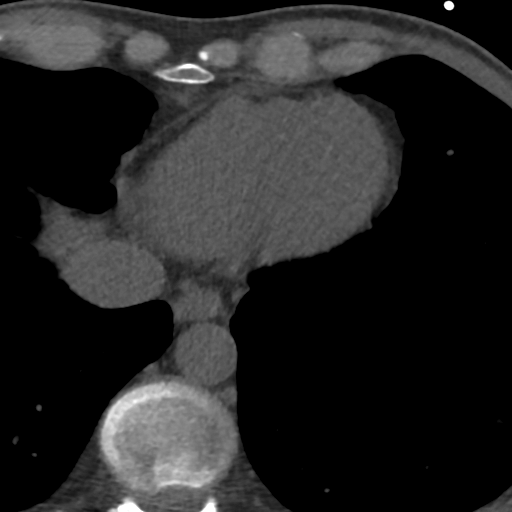
[im 17/51  vessel]
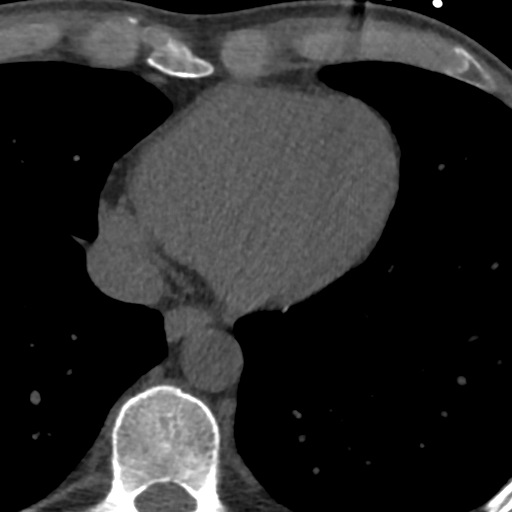
[im 23/51  vessel]
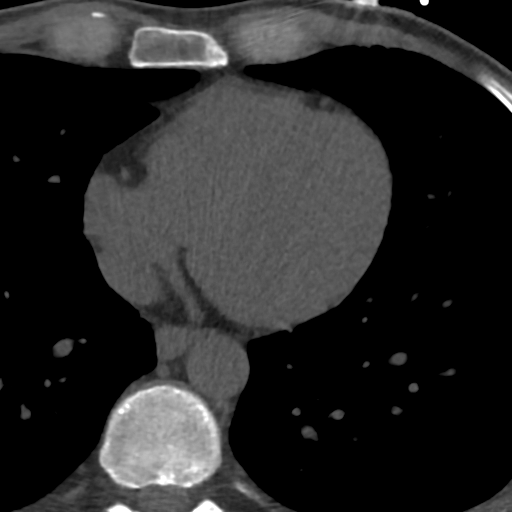
[im 28/51  vessel]
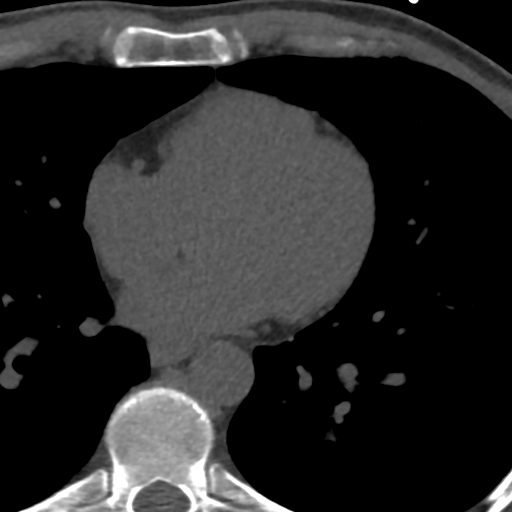
[im 28/51  lung]
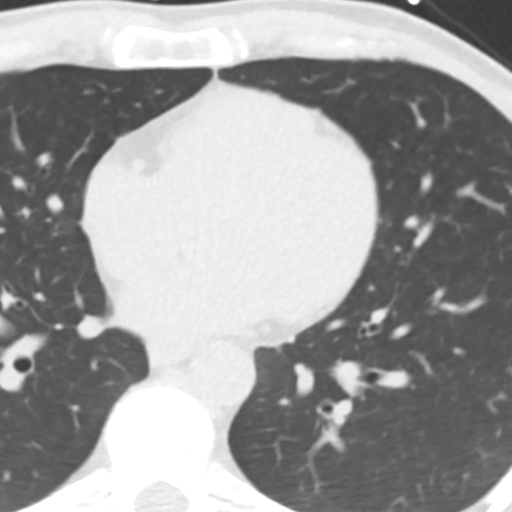
[im 34/51  vessel]
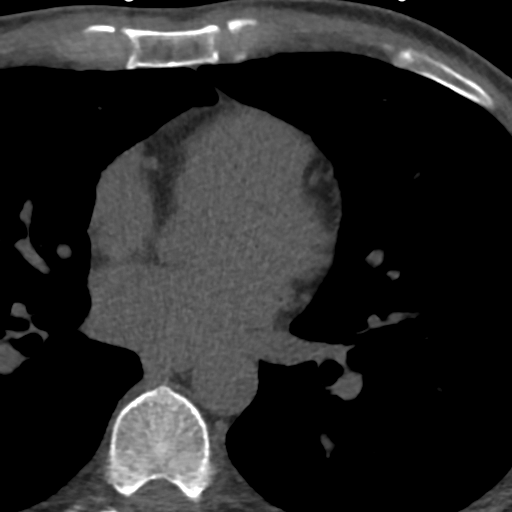
[im 39/51  vessel]
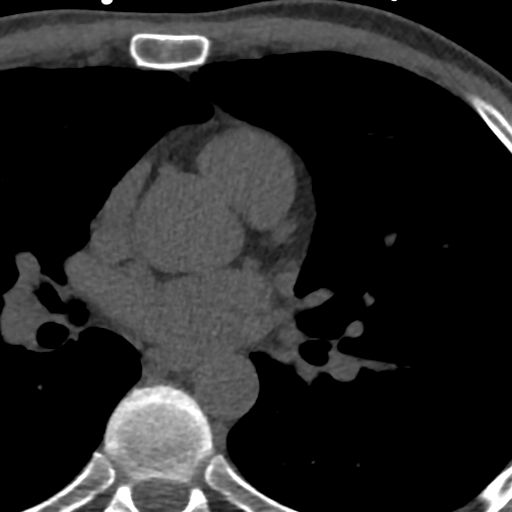
[im 45/51  vessel]
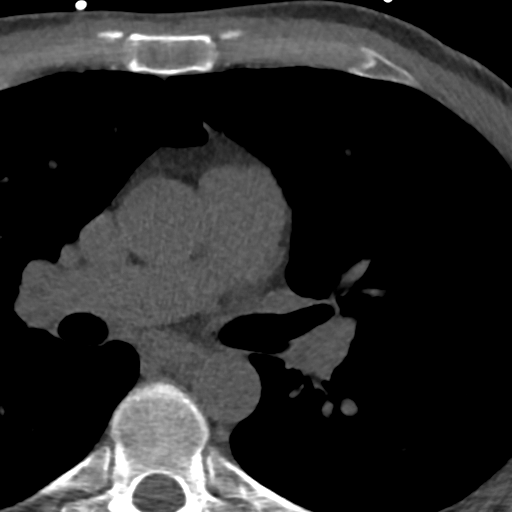

[Series 4: lung st 67 % · axial · 0.68mm/px · z∈[-262,-146]mm · 8 of 51 slices shown]
[im 6/51  lung]
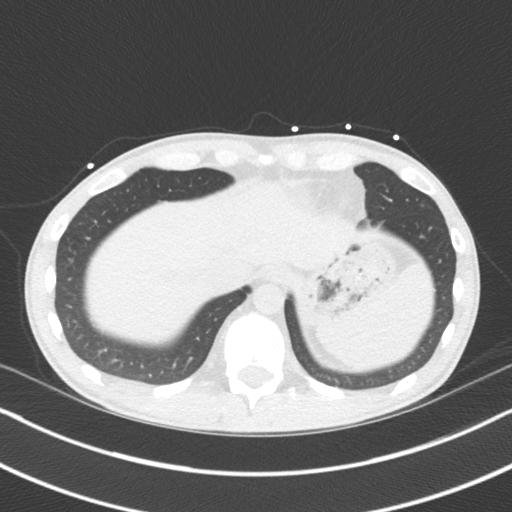
[im 12/51  lung]
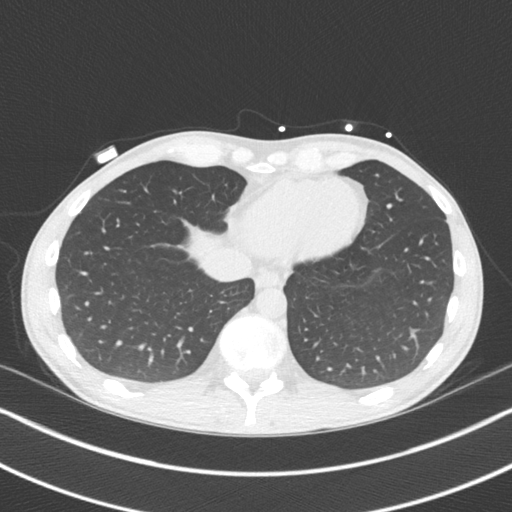
[im 17/51  lung]
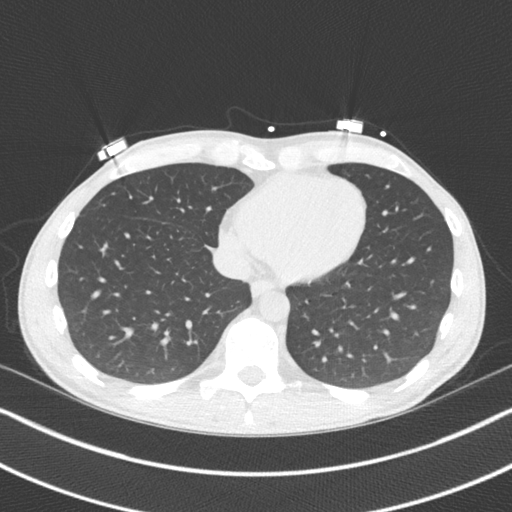
[im 23/51  lung]
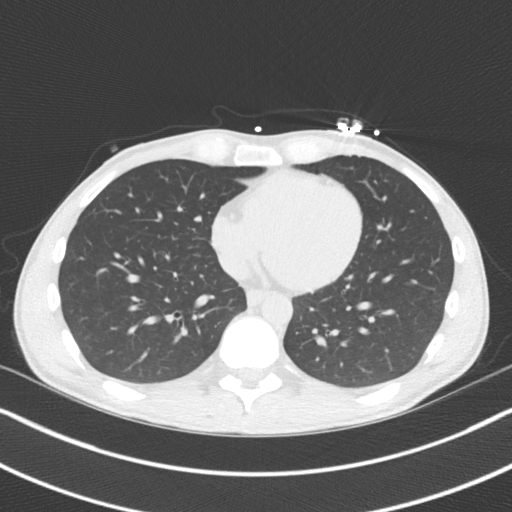
[im 28/51  lung]
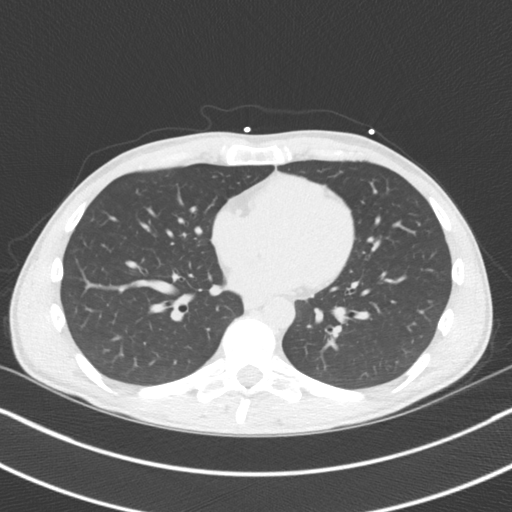
[im 34/51  lung]
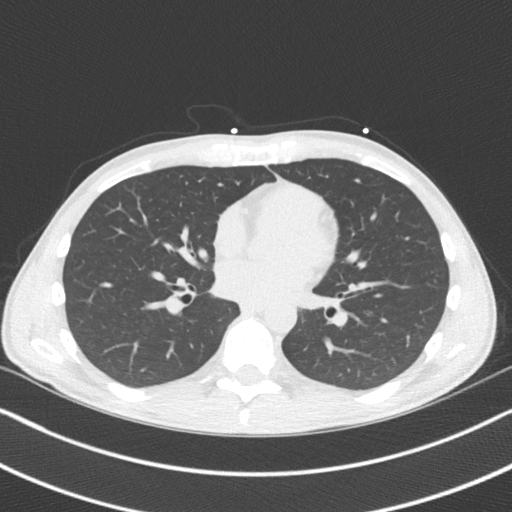
[im 39/51  lung]
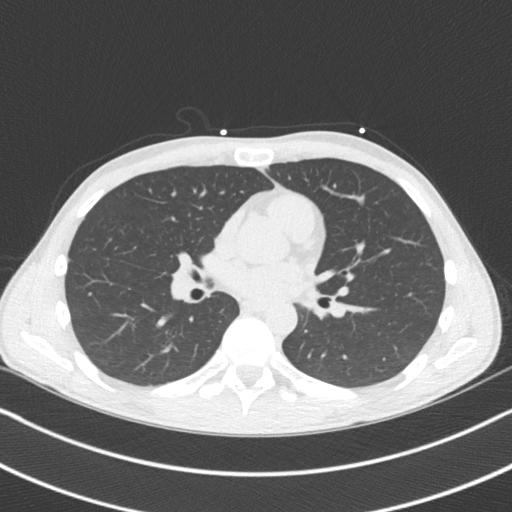
[im 45/51  lung]
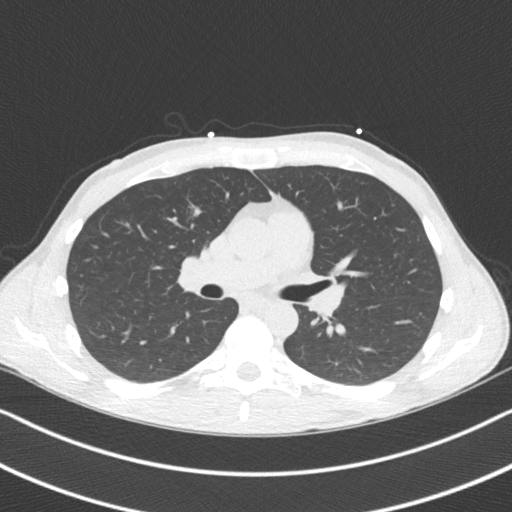

[16 of 20 positions shown; findings below may reference images not displayed]

FINDINGS: Non-cardiac: See separate report from [REDACTED].

Ascending Aorta: Normal size, no calcifications.

Pericardium: Normal.

Coronary arteries: Normal origin.
IMPRESSION: Coronary calcium score of 0. This was 0 percentile for age and sex
matched control.

EXAM:
OVER-READ INTERPRETATION  CT CHEST

The following report is an over-read performed by radiologist Dr.
Laurind Urbain [REDACTED] on 01/09/2018. This
over-read does not include interpretation of cardiac or coronary
anatomy or pathology. The coronary calcium score interpretation by
the cardiologist is attached.
FINDINGS: Within the visualized portions of the thorax there are no suspicious
appearing pulmonary nodules or masses, there is no acute
consolidative airspace disease, no pleural effusions, no
pneumothorax and no lymphadenopathy. Visualized portions of the
upper abdomen are unremarkable. There are no aggressive appearing
lytic or blastic lesions noted in the visualized portions of the
skeleton.
IMPRESSION: 1. No significant incidental noncardiac findings are noted.

## 2019-07-07 ENCOUNTER — Other Ambulatory Visit: Payer: Self-pay | Admitting: Gastroenterology

## 2019-07-07 DIAGNOSIS — K219 Gastro-esophageal reflux disease without esophagitis: Secondary | ICD-10-CM

## 2019-07-07 DIAGNOSIS — R634 Abnormal weight loss: Secondary | ICD-10-CM

## 2019-07-07 DIAGNOSIS — R1084 Generalized abdominal pain: Secondary | ICD-10-CM

## 2019-07-15 ENCOUNTER — Encounter (INDEPENDENT_AMBULATORY_CARE_PROVIDER_SITE_OTHER): Payer: Self-pay

## 2019-07-15 ENCOUNTER — Other Ambulatory Visit: Payer: Self-pay

## 2019-07-15 ENCOUNTER — Ambulatory Visit
Admission: RE | Admit: 2019-07-15 | Discharge: 2019-07-15 | Disposition: A | Discharge status: Home / Self Care | Payer: BC Managed Care – PPO | Source: Ambulatory Visit | Attending: Gastroenterology | Admitting: Gastroenterology

## 2019-07-15 DIAGNOSIS — R1084 Generalized abdominal pain: Secondary | ICD-10-CM | POA: Diagnosis present

## 2019-07-15 DIAGNOSIS — K219 Gastro-esophageal reflux disease without esophagitis: Secondary | ICD-10-CM | POA: Insufficient documentation

## 2019-07-15 DIAGNOSIS — R634 Abnormal weight loss: Secondary | ICD-10-CM

## 2019-07-15 MED ORDER — IOHEXOL 300 MG/ML  SOLN
100.0000 mL | Freq: Once | INTRAMUSCULAR | Status: AC | PRN
  Administered 2019-07-15: 100 mL via INTRAVENOUS

## 2019-11-03 ENCOUNTER — Telehealth: Payer: BC Managed Care – PPO | Admitting: Family

## 2019-11-03 DIAGNOSIS — K120 Recurrent oral aphthae: Secondary | ICD-10-CM

## 2019-11-03 MED ORDER — TRIAMCINOLONE ACETONIDE 0.1 % MT PSTE: 1.0000 "application " | PASTE | Freq: Two times a day (BID) | OROMUCOSAL | 12 refills | Status: DC

## 2019-11-03 NOTE — Progress Notes (Signed)
We are sorry that you are not feeling well.  Here is how we plan to help!  Based on what you have shared with me it looks like you have a canker sore. This is mouth ulcer. I have sent in kenalog oral paste that you will place on the area twice a day. Avoid spicy, citrus foods.   HOME CARE INSTRUCTIONS   Only take over-the-counter or prescription medicines for pain, discomfort, or fever as directed by your caregiver. Do not use aspirin.   Use a cotton-tip swab to apply creams or gels to your sores.   Do not touch the sores or pick the scabs. Wash your hands often. Do not touch your eyes without washing your hands first.   Avoid kissing, oral sex, and sharing personal items until sores heal.   Apply an ice pack on your sores for 10-15 minutes to ease any discomfort.   Avoid hot, cold, or salty foods because they may hurt your mouth. Eat a soft, bland diet to avoid irritating the sores. Use a straw to drink if you have pain when drinking out of a glass.   Keep sores clean and dry to prevent an infection of other tissues.   Avoid the sun and limit stress if these things trigger outbreaks. If sun causes cold sores, apply sunscreen on the lips before being out in the sun.  SEEK MEDICAL CARE IF:   You have a fever or persistent symptoms for more than 2-3 days.   You have a fever and your symptoms suddenly get worse.   You have pus, not clear fluid, coming from the sores.   You have redness that is spreading.   You have pain or irritation in your eye.   You get sores on your genitals.   Your sores do not heal within 2 weeks.   You have a weakened immune system.   You have frequent recurrences of cold sores.    MAKE SURE YOU   Understand these instructions.  Will watch your condition.  Will get help right away if you are not doing well or get worse.  Your e-visit answers were reviewed by a board certified advanced clinical practitioner to complete your  personal care plan.  Depending on the condition, your plan could have included both over the counter or prescription medications.  If there is a problem please reply  once you have received a response from your provider.  Your safety is important to [place].  If you have drug allergies check your prescription carefully.    You can use MyChart to ask questions about today's visit, request a non-urgent call back, or ask for a work or school excuse.  You will get an e-mail in the next two days asking about your experience.  I hope that your e-visit has been valuable and will speed your recovery. Thank you for using e-visits.    Approximately 5 minutes was spent documenting and reviewing patient's chart.

## 2020-06-21 DIAGNOSIS — M539 Dorsopathy, unspecified: Secondary | ICD-10-CM | POA: Insufficient documentation

## 2021-12-13 DIAGNOSIS — D126 Benign neoplasm of colon, unspecified: Secondary | ICD-10-CM | POA: Insufficient documentation

## 2023-04-17 ENCOUNTER — Ambulatory Visit (INDEPENDENT_AMBULATORY_CARE_PROVIDER_SITE_OTHER): Payer: Managed Care, Other (non HMO) | Admitting: Urology

## 2023-04-17 VITALS — BP 127/71 | HR 80 | Ht 74.0 in | Wt 163.1 lb

## 2023-04-17 DIAGNOSIS — R102 Pelvic and perineal pain: Secondary | ICD-10-CM

## 2023-04-17 DIAGNOSIS — R300 Dysuria: Secondary | ICD-10-CM | POA: Diagnosis not present

## 2023-04-17 LAB — URINALYSIS, COMPLETE
Bilirubin, UA: NEGATIVE
Glucose, UA: NEGATIVE
Ketones, UA: NEGATIVE
Leukocytes,UA: NEGATIVE
Nitrite, UA: NEGATIVE
Protein,UA: NEGATIVE
RBC, UA: NEGATIVE
Specific Gravity, UA: 1.025 (ref 1.005–1.030)
Urobilinogen, Ur: 0.2 mg/dL (ref 0.2–1.0)
pH, UA: 6.5 (ref 5.0–7.5)

## 2023-04-17 LAB — MICROSCOPIC EXAMINATION

## 2023-04-17 MED ORDER — TAMSULOSIN HCL 0.4 MG PO CAPS: 0.4000 mg | ORAL_CAPSULE | Freq: Every day | ORAL | 3 refills | Status: DC

## 2023-04-17 NOTE — Progress Notes (Signed)
Marcelle Overlie Plume,acting as a scribe for Riki Altes, MD.,have documented all relevant documentation on the behalf of Riki Altes, MD,as directed by  Riki Altes, MD while in the presence of Riki Altes, MD.  04/17/2023 1:42 PM   Kathrynn Humble Lorin Picket Feb 24, 1981 [id number]  Referring provider: Lauro Regulus, MD [address] [facility] [place] [address] [place],  [place] [address]  Chief Complaint  Patient presents with   Establish Care   Dysuria    HPI: Alex Mayer is a 42 y.o. male who is referred for evaluation of dysuria.  Saw Dr. Dareen Piano on 03/04/2023 with a 1 week history of mild dysuria. Urinalysis showed no RBC/WBC. He had a negative urine culture and negative PCR for chlamydia/neisseria. He was treated with Bactrim DS. Follow up testing on 03/26/2023 was also negative Onset of discomfort at the end of urination, which he described as a burning sensation and felt "deeper inside"  some association with low back pain and discomfort with ejaculation Underwent 2 antibiotic courses with minimal improvement in symptoms.  History of bulging disc and symptoms are worse when he is having back pain No gross hematuria  PMH: Past Medical History:  Diagnosis Date   GERD (gastroesophageal reflux disease)     Surgical History: Past Surgical History:  Procedure Laterality Date   COLONOSCOPY WITH PROPOFOL N/A 12/30/2017   Procedure: COLONOSCOPY WITH PROPOFOL;  Surgeon: Wyline Mood, MD;  Location: [facility] ENDOSCOPY;  Service: Gastroenterology;  Laterality: N/A;   ESOPHAGOGASTRODUODENOSCOPY (EGD) WITH PROPOFOL N/A 12/30/2017   Procedure: ESOPHAGOGASTRODUODENOSCOPY (EGD) WITH PROPOFOL;  Surgeon: Wyline Mood, MD;  Location: [facility] ENDOSCOPY;  Service: Gastroenterology;  Laterality: N/A;   NO PAST SURGERIES      Home Medications:  Allergies as of 04/17/2023   No Known Allergies      Medication List        Accurate as of Apr 17, 2023  1:42 PM. If you have  any questions, ask your nurse or doctor.          STOP taking these medications    pantoprazole 40 MG tablet Commonly known as: PROTONIX Stopped by: Riki Altes, MD   sucralfate 1 GM/10ML suspension Commonly known as: CARAFATE Stopped by: Riki Altes, MD   triamcinolone 0.1 % paste Commonly known as: KENALOG Stopped by: Riki Altes, MD       TAKE these medications    B-D 3CC LUER-LOK SYR 23GX1" 23G X 1" 3 ML Misc Generic drug: SYRINGE-NEEDLE (DISP) 3 ML See admin instructions.   OVER THE COUNTER MEDICATION Ibutamoren 25 mg daily   testosterone cypionate 200 MG/ML injection Commonly known as: DEPOTESTOSTERONE CYPIONATE Inject into the muscle. Inject 0.85 mLs into the muscle once a week        Family History: Family History  Problem Relation Age of Onset   Colon polyps Father        started in his 56s; unsure of # or type.    Cancer Father        unsure of type but reportedly received chemotherapy   Stomach cancer Maternal Grandfather        dx 90s; deceased 26s    Social History:  reports that he has been smoking cigarettes. He has never used smokeless tobacco. He reports current drug use. Drug: Marijuana. He reports that he does not drink alcohol.   Physical Exam: BP 127/71   Pulse 80   Ht 6\' 2"  (1.88 m)  Wt 163 lb 1 oz (74 kg)   BMI 20.94 kg/m   Constitutional:  Alert and oriented, No acute distress. HEENT: [place] AT Respiratory: Normal respiratory effort, no increased work of breathing. GU: Prostate 40 grams, smooth without nodules Skin: No rashes, bruises or suspicious lesions. Neurologic: Grossly intact, no focal deficits, moving all 4 extremities. Psychiatric: Normal mood and affect.  Laboratory Data:  Urinalysis Dipstick/microscopy negative   Assessment & Plan:    1. Pelvic pain Describes burning sensation at end of urination Discussed symptoms are consistent with prostatic inflammation. We discussed that greater than 90%  of prostatitis is not infectious and inflammatory in etiology and that antibiotics are generally not effective if he has negative urine testing He is currently asymptomatic- Rx tamsulosin 0.4 mg daily sent to pharmacy should his symptoms recur and recommend that he take 2-4 weeks as needed.  For persistent worsening symptoms, scheduled cystoscopy to evaluate for urethral stricture  I have reviewed the above documentation for accuracy and completeness, and I agree with the above.   Riki Altes, MD  [facility] [address], [address] [place], [place] [address] [phone]

## 2023-04-18 ENCOUNTER — Encounter: Payer: Self-pay | Admitting: Urology

## 2023-07-07 ENCOUNTER — Encounter: Payer: Self-pay | Admitting: Gastroenterology

## 2023-07-14 ENCOUNTER — Encounter: Payer: Self-pay | Admitting: Gastroenterology

## 2023-07-17 ENCOUNTER — Encounter: Payer: Self-pay | Admitting: Gastroenterology

## 2023-07-19 ENCOUNTER — Other Ambulatory Visit: Payer: Self-pay | Admitting: Urology

## 2023-07-21 ENCOUNTER — Encounter: Payer: Self-pay | Admitting: Gastroenterology

## 2023-07-21 ENCOUNTER — Other Ambulatory Visit: Payer: Self-pay

## 2023-07-21 ENCOUNTER — Ambulatory Visit: Payer: Managed Care, Other (non HMO) | Admitting: Anesthesiology

## 2023-07-21 ENCOUNTER — Ambulatory Visit
Admission: RE | Admit: 2023-07-21 | Discharge: 2023-07-21 | Disposition: A | Discharge status: Home / Self Care | Payer: Managed Care, Other (non HMO) | Source: Ambulatory Visit | Attending: Gastroenterology | Admitting: Gastroenterology

## 2023-07-21 ENCOUNTER — Encounter
Admission: RE | Disposition: A | Discharge status: Home / Self Care | Payer: Self-pay | Source: Ambulatory Visit | Attending: Gastroenterology

## 2023-07-21 DIAGNOSIS — Z1211 Encounter for screening for malignant neoplasm of colon: Secondary | ICD-10-CM | POA: Insufficient documentation

## 2023-07-21 DIAGNOSIS — D122 Benign neoplasm of ascending colon: Secondary | ICD-10-CM | POA: Diagnosis not present

## 2023-07-21 DIAGNOSIS — Z83719 Family history of colon polyps, unspecified: Secondary | ICD-10-CM | POA: Insufficient documentation

## 2023-07-21 DIAGNOSIS — Z86010 Personal history of colonic polyps: Secondary | ICD-10-CM | POA: Insufficient documentation

## 2023-07-21 DIAGNOSIS — K640 First degree hemorrhoids: Secondary | ICD-10-CM | POA: Insufficient documentation

## 2023-07-21 HISTORY — DX: Other intervertebral disc degeneration, lumbar region without mention of lumbar back pain or lower extremity pain: M51.369

## 2023-07-21 HISTORY — DX: Constipation, unspecified: K59.00

## 2023-07-21 HISTORY — DX: Anxiety disorder, unspecified: F41.9

## 2023-07-21 HISTORY — DX: Family history of other specified conditions: Z84.89

## 2023-07-21 HISTORY — PX: COLONOSCOPY WITH PROPOFOL: SHX5780

## 2023-07-21 HISTORY — DX: Depression, unspecified: F32.A

## 2023-07-21 HISTORY — PX: POLYPECTOMY: SHX5525

## 2023-07-21 HISTORY — DX: Polyp of colon: K63.5

## 2023-07-21 HISTORY — DX: Other intervertebral disc degeneration, lumbar region: M51.36

## 2023-07-21 SURGERY — COLONOSCOPY WITH PROPOFOL
Anesthesia: General

## 2023-07-21 MED ORDER — MIDAZOLAM HCL 2 MG/2ML IJ SOLN
INTRAMUSCULAR | Status: AC
  Filled 2023-07-21: qty 2

## 2023-07-21 MED ORDER — LIDOCAINE HCL (CARDIAC) PF 100 MG/5ML IV SOSY
PREFILLED_SYRINGE | INTRAVENOUS | Status: DC | PRN
  Administered 2023-07-21: 100 mg via INTRAVENOUS

## 2023-07-21 MED ORDER — MIDAZOLAM HCL 2 MG/2ML IJ SOLN
INTRAMUSCULAR | Status: DC | PRN
  Administered 2023-07-21: 2 mg via INTRAVENOUS

## 2023-07-21 MED ORDER — PROPOFOL 10 MG/ML IV BOLUS
INTRAVENOUS | Status: DC | PRN
Start: 2023-07-21 — End: 2023-07-21
  Administered 2023-07-21: 50 mg via INTRAVENOUS
  Administered 2023-07-21: 10 mg via INTRAVENOUS
  Administered 2023-07-21: 20 mg via INTRAVENOUS

## 2023-07-21 MED ORDER — PROPOFOL 500 MG/50ML IV EMUL
INTRAVENOUS | Status: DC | PRN
  Administered 2023-07-21: 165 ug/kg/min via INTRAVENOUS

## 2023-07-21 MED ORDER — PHENYLEPHRINE 80 MCG/ML (10ML) SYRINGE FOR IV PUSH (FOR BLOOD PRESSURE SUPPORT)
PREFILLED_SYRINGE | INTRAVENOUS | Status: DC | PRN
  Administered 2023-07-21: 160 ug via INTRAVENOUS

## 2023-07-21 MED ORDER — SODIUM CHLORIDE 0.9 % IV SOLN: INTRAVENOUS | Status: DC

## 2023-07-21 MED ORDER — VASOPRESSIN 20 UNIT/ML IV SOLN
INTRAVENOUS | Status: DC | PRN
  Administered 2023-07-21 (×2): 2 [IU] via INTRAVENOUS

## 2023-07-21 NOTE — Transfer of Care (Signed)
Immediate Anesthesia Transfer of Care Note  Patient: Alex Mayer  Procedure(s) Performed: COLONOSCOPY WITH PROPOFOL POLYPECTOMY  Patient Location: Endoscopy Unit  Anesthesia Type:General  Level of Consciousness: drowsy and patient cooperative  Airway & Oxygen Therapy: Patient Spontanous Breathing and Patient connected to face mask oxygen  Post-op Assessment: Report given to RN and Post -op Vital signs reviewed and stable  Post vital signs: Reviewed and stable  Last Vitals:  Vitals Value Taken Time  BP 92/50 07/21/23 0852  Temp    Pulse    Resp 62 07/21/23 0850  SpO2 99 % 07/21/23 0850    Last Pain:  Vitals:   07/21/23 0731  TempSrc: Temporal  PainSc: 0-No pain         Complications: No notable events documented.

## 2023-07-21 NOTE — Anesthesia Postprocedure Evaluation (Signed)
Anesthesia Post Note  Patient: Alex Mayer  Procedure(s) Performed: COLONOSCOPY WITH PROPOFOL POLYPECTOMY  Patient location during evaluation: Endoscopy Anesthesia Type: General Level of consciousness: awake and alert Pain management: pain level controlled Vital Signs Assessment: post-procedure vital signs reviewed and stable Respiratory status: spontaneous breathing, nonlabored ventilation, respiratory function stable and patient connected to nasal cannula oxygen Cardiovascular status: blood pressure returned to baseline and stable Postop Assessment: no apparent nausea or vomiting Anesthetic complications: no   No notable events documented.   Last Vitals:  Vitals:   07/21/23 0914 07/21/23 0925  BP: 106/67 102/65  Pulse: (!) 53 68  Resp: 11 14  Temp: (!) 35.5 C   SpO2: 100% 100%    Last Pain:  Vitals:   07/21/23 0925  TempSrc:   PainSc: 0-No pain                 Cleda Mccreedy Sonam Huelsmann

## 2023-07-21 NOTE — Anesthesia Preprocedure Evaluation (Signed)
Anesthesia Evaluation  Patient identified by MRN, date of birth, ID band Patient awake    Reviewed: Allergy & Precautions, NPO status , Patient's Chart, lab work & pertinent test results  History of Anesthesia Complications Negative for: history of anesthetic complications  Airway Mallampati: III  TM Distance: >3 FB Neck ROM: full    Dental  (+) Chipped, Poor Dentition   Pulmonary neg shortness of breath, former smoker   Pulmonary exam normal        Cardiovascular Exercise Tolerance: Good (-) angina negative cardio ROS Normal cardiovascular exam     Neuro/Psych  PSYCHIATRIC DISORDERS      negative neurological ROS     GI/Hepatic Neg liver ROS,GERD  Controlled,,  Endo/Other  negative endocrine ROS    Renal/GU negative Renal ROS  negative genitourinary   Musculoskeletal   Abdominal   Peds  Hematology negative hematology ROS (+)   Anesthesia Other Findings Past Medical History: No date: Anxiety No date: Colon polyps No date: Constipation No date: DDD (degenerative disc disease), lumbar No date: Depression No date: Family history of adverse reaction to anesthesia No date: GERD (gastroesophageal reflux disease)  Past Surgical History: 12/30/2017: COLONOSCOPY WITH PROPOFOL; N/A     Comment:  Procedure: COLONOSCOPY WITH PROPOFOL;  Surgeon: Wyline Mood, MD;  Location: [facility] ENDOSCOPY;  Service:               Gastroenterology;  Laterality: N/A; 12/30/2017: ESOPHAGOGASTRODUODENOSCOPY (EGD) WITH PROPOFOL; N/A     Comment:  Procedure: ESOPHAGOGASTRODUODENOSCOPY (EGD) WITH               PROPOFOL;  Surgeon: Wyline Mood, MD;  Location: [facility]               ENDOSCOPY;  Service: Gastroenterology;  Laterality: N/A; No date: NO PAST SURGERIES  BMI    Body Mass Index: 21.37 kg/m      Reproductive/Obstetrics negative OB ROS                             Anesthesia Physical Anesthesia  Plan  ASA: 2  Anesthesia Plan: General   Post-op Pain Management:    Induction: Intravenous  PONV Risk Score and Plan: Propofol infusion and TIVA  Airway Management Planned: Natural Airway and Nasal Cannula  Additional Equipment:   Intra-op Plan:   Post-operative Plan:   Informed Consent: I have reviewed the patients History and Physical, chart, labs and discussed the procedure including the risks, benefits and alternatives for the proposed anesthesia with the patient or authorized representative who has indicated his/her understanding and acceptance.     Dental Advisory Given  Plan Discussed with: Anesthesiologist, CRNA and Surgeon  Anesthesia Plan Comments: (Patient consented for risks of anesthesia including but not limited to:  - adverse reactions to medications - risk of airway placement if required - damage to eyes, teeth, lips or other oral mucosa - nerve damage due to positioning  - sore throat or hoarseness - Damage to heart, brain, nerves, lungs, other parts of body or loss of life  Patient voiced understanding.)       Anesthesia Quick Evaluation

## 2023-07-21 NOTE — Interval H&P Note (Signed)
History and Physical Interval Note: Preprocedure H&P from 07/21/23  was reviewed and there was no interval change after seeing and examining the patient.  Written consent was obtained from the patient after discussion of risks, benefits, and alternatives. Patient has consented to proceed with Colonoscopy with possible intervention   07/21/2023 8:19 AM  Alex Mayer  has presented today for surgery, with the diagnosis of Z86.010 (ICD-10-CM) - History of colon polyps.  The various methods of treatment have been discussed with the patient and family. After consideration of risks, benefits and other options for treatment, the patient has consented to  Procedure(s): COLONOSCOPY WITH PROPOFOL (N/A) as a surgical intervention.  The patient's history has been reviewed, patient examined, no change in status, stable for surgery.  I have reviewed the patient's chart and labs.  Questions were answered to the patient's satisfaction.     Jaynie Collins

## 2023-07-21 NOTE — H&P (Signed)
Pre-Procedure H&P   Patient ID: Alex Mayer is a 42 y.o. male.  Gastroenterology Provider: Jaynie Collins, DO  Referring Provider: Dr. Dareen Piano PCP: [facility]  Date: 07/21/2023  HPI Alex Mayer is a 42 y.o. male who presents today for Colonoscopy for Surveillance-personal history of colon polyps .  Father with history of colon polyps.  No family history colorectal cancer.  He underwent colonoscopy in January 2019 with an 8 mm adenomatous polyp.  Reports 1-2 bowel movements daily without melena or hematochezia.  No other acute GI complaints   Past Medical History:  Diagnosis Date   Anxiety    Colon polyps    Constipation    DDD (degenerative disc disease), lumbar    Depression    Family history of adverse reaction to anesthesia    GERD (gastroesophageal reflux disease)     Past Surgical History:  Procedure Laterality Date   COLONOSCOPY WITH PROPOFOL N/A 12/30/2017   Procedure: COLONOSCOPY WITH PROPOFOL;  Surgeon: Wyline Mood, MD;  Location: [facility] ENDOSCOPY;  Service: Gastroenterology;  Laterality: N/A;   ESOPHAGOGASTRODUODENOSCOPY (EGD) WITH PROPOFOL N/A 12/30/2017   Procedure: ESOPHAGOGASTRODUODENOSCOPY (EGD) WITH PROPOFOL;  Surgeon: Wyline Mood, MD;  Location: [facility] ENDOSCOPY;  Service: Gastroenterology;  Laterality: N/A;   NO PAST SURGERIES      Family History Father- polyps No h/o GI disease or malignancy  Review of Systems  Constitutional:  Negative for activity change, appetite change, chills, diaphoresis, fatigue, fever and unexpected weight change.  HENT:  Negative for trouble swallowing and voice change.   Respiratory:  Negative for shortness of breath and wheezing.   Cardiovascular:  Negative for chest pain, palpitations and leg swelling.  Gastrointestinal:  Negative for abdominal distention, abdominal pain, anal bleeding, blood in stool, constipation, diarrhea, nausea and vomiting.  Musculoskeletal:  Negative for  arthralgias and myalgias.  Skin:  Negative for color change and pallor.  Neurological:  Negative for dizziness, syncope and weakness.  Psychiatric/Behavioral:  Negative for confusion. The patient is not nervous/anxious.   All other systems reviewed and are negative.    Medications No current facility-administered medications on file prior to encounter.   Current Outpatient Medications on File Prior to Encounter  Medication Sig Dispense Refill   CREATINE MONOHYDRATE PO Take by mouth.     OVER THE COUNTER MEDICATION Ibutamoren 25 mg daily (Patient not taking: Reported on 07/21/2023)     SYRINGE-NEEDLE, DISP, 3 ML (B-D 3CC LUER-LOK SYR 23GX1") 23G X 1" 3 ML MISC See admin instructions.     tamsulosin (FLOMAX) 0.4 MG CAPS capsule Take 1 capsule (0.4 mg total) by mouth daily. (Patient not taking: Reported on 07/21/2023) 30 capsule 3   testosterone cypionate (DEPOTESTOSTERONE CYPIONATE) 200 MG/ML injection Inject into the muscle. Inject 0.85 mLs into the muscle once a week      Pertinent medications related to GI and procedure were reviewed by me with the patient prior to the procedure   Current Facility-Administered Medications:    0.9 %  sodium chloride infusion, , Intravenous, Continuous, Jaynie Collins, DO, Last Rate: 20 mL/hr at 07/21/23 0745, New Bag at 07/21/23 0745  sodium chloride 20 mL/hr at 07/21/23 0745       No Known Allergies Allergies were reviewed by me prior to the procedure  Objective   Body mass index is 21.37 kg/m. Vitals:   07/21/23 0731  BP: 101/70  Pulse: (!) 57  Resp: 16  Temp: (!) 96.3 F (35.7 C)  TempSrc: Temporal  SpO2: 100%  Weight: 71.5 kg  Height: 6' (1.829 m)     Physical Exam Vitals and nursing note reviewed.  Constitutional:      General: He is not in acute distress.    Appearance: Normal appearance. He is not ill-appearing, toxic-appearing or diaphoretic.  HENT:     Head: Normocephalic and atraumatic.     Nose: Nose normal.      Mouth/Throat:     Mouth: Mucous membranes are moist.     Pharynx: Oropharynx is clear.  Eyes:     General: No scleral icterus.    Extraocular Movements: Extraocular movements intact.  Cardiovascular:     Rate and Rhythm: Regular rhythm. Bradycardia present.     Heart sounds: Normal heart sounds. No murmur heard.    No friction rub. No gallop.  Pulmonary:     Effort: Pulmonary effort is normal. No respiratory distress.     Breath sounds: Normal breath sounds. No wheezing, rhonchi or rales.  Abdominal:     General: Bowel sounds are normal. There is no distension.     Palpations: Abdomen is soft.     Tenderness: There is no abdominal tenderness. There is no guarding or rebound.  Musculoskeletal:     Cervical back: Neck supple.     Right lower leg: No edema.     Left lower leg: No edema.  Skin:    General: Skin is warm and dry.     Coloration: Skin is not jaundiced or pale.  Neurological:     General: No focal deficit present.     Mental Status: He is alert and oriented to person, place, and time. Mental status is at baseline.  Psychiatric:        Mood and Affect: Mood normal.        Behavior: Behavior normal.        Thought Content: Thought content normal.        Judgment: Judgment normal.      Assessment:  Alex Mayer is a 42 y.o. male  who presents today for Colonoscopy for Surveillance-personal history of colon polyps .  Plan:  Colonoscopy with possible intervention today  Colonoscopy with possible biopsy, control of bleeding, polypectomy, and interventions as necessary has been discussed with the patient/patient representative. Informed consent was obtained from the patient/patient representative after explaining the indication, nature, and risks of the procedure including but not limited to death, bleeding, perforation, missed neoplasm/lesions, cardiorespiratory compromise, and reaction to medications. Opportunity for questions was given and appropriate  answers were provided. Patient/patient representative has verbalized understanding is amenable to undergoing the procedure.   Jaynie Collins, DO  [facility] Gastroenterology  Portions of the record may have been created with voice recognition software. Occasional wrong-word or 'sound-a-like' substitutions may have occurred due to the inherent limitations of voice recognition software.  Read the chart carefully and recognize, using context, where substitutions may have occurred.

## 2023-07-21 NOTE — Anesthesia Procedure Notes (Signed)
Procedure Name: General with mask airway Date/Time: 07/21/2023 8:28 AM  Performed by: Mohammed Kindle, CRNAPre-anesthesia Checklist: Patient identified, Emergency Drugs available, Suction available and Patient being monitored Patient Re-evaluated:Patient Re-evaluated prior to induction Oxygen Delivery Method: Simple face mask Induction Type: IV induction Placement Confirmation: positive ETCO2, CO2 detector and breath sounds checked- equal and bilateral Dental Injury: Teeth and Oropharynx as per pre-operative assessment

## 2023-07-21 NOTE — Op Note (Signed)
[facility] Patient Name: Alex Mayer Procedure Date: 07/21/2023 8:20 AM MRN: [id number] Account #: [id number] Date of Birth: [birth year] Admit Type: Outpatient Age: 42 Room: [facility] ENDO ROOM 2 Gender: Male Note Status: Finalized Instrument Name: Colonoscope [id number] Procedure:             Colonoscopy Indications:           High risk colon cancer surveillance: Personal history                         of colonic polyps Providers:             Jaynie Collins DO, DO Referring MD:          No Local Md, MD (Referring MD) Medicines:             Monitored Anesthesia Care Complications:         No immediate complications. Estimated blood loss:                         Minimal. Procedure:             Pre-Anesthesia Assessment:                        - Prior to the procedure, a History and Physical was                         performed, and patient medications and allergies were                         reviewed. The patient is competent. The risks and                         benefits of the procedure and the sedation options and                         risks were discussed with the patient. All questions                         were answered and informed consent was obtained.                         Patient identification and proposed procedure were                         verified by the physician, the nurse, the anesthetist                         and the technician in the endoscopy suite. Mental                         Status Examination: alert and oriented. Airway                         Examination: normal oropharyngeal airway and neck                         mobility. Respiratory Examination: clear to  auscultation. CV Examination: bradycardia noted.                         Prophylactic Antibiotics: The patient does not require                         prophylactic antibiotics. Prior Anticoagulants: The                          patient has taken no anticoagulant or antiplatelet                         agents. ASA Grade Assessment: II - A patient with mild                         systemic disease. After reviewing the risks and                         benefits, the patient was deemed in satisfactory                         condition to undergo the procedure. The anesthesia                         plan was to use monitored anesthesia care (MAC).                         Immediately prior to administration of medications,                         the patient was re-assessed for adequacy to receive                         sedatives. The heart rate, respiratory rate, oxygen                         saturations, blood pressure, adequacy of pulmonary                         ventilation, and response to care were monitored                         throughout the procedure. The physical status of the                         patient was re-assessed after the procedure.                        After obtaining informed consent, the colonoscope was                         passed under direct vision. Throughout the procedure,                         the patient's blood pressure, pulse, and oxygen                         saturations were monitored continuously. The  Colonoscope was introduced through the anus and                         advanced to the the terminal ileum, with                         identification of the appendiceal orifice and IC                         valve. The colonoscopy was performed without                         difficulty. The patient tolerated the procedure well.                         The quality of the bowel preparation was evaluated                         using the BBPS [facility] Bowel Preparation Scale) with                         scores of: Right Colon = 2 (minor amount of residual                         staining, small fragments of stool and/or opaque                          liquid, but mucosa seen well), Transverse Colon = 3                         (entire mucosa seen well with no residual staining,                         small fragments of stool or opaque liquid) and Left                         Colon = 2 (minor amount of residual staining, small                         fragments of stool and/or opaque liquid, but mucosa                         seen well). The total BBPS score equals 7. The quality                         of the bowel preparation was good. The terminal ileum,                         ileocecal valve, appendiceal orifice, and rectum were                         photographed. Findings:      The terminal ileum appeared normal. Estimated blood loss: none.      Retroflexion in the right colon was performed.      A 3 to 4 mm polyp was found in the ascending colon. The polyp was       sessile.  The polyp was removed with a cold snare. Resection and       retrieval were complete. Estimated blood loss was minimal.      Non-bleeding internal hemorrhoids were found during retroflexion. The       hemorrhoids were Grade I (internal hemorrhoids that do not prolapse).       Estimated blood loss: none.      The exam was otherwise without abnormality on direct and retroflexion       views. Impression:            - The examined portion of the ileum was normal.                        - One 3 to 4 mm polyp in the ascending colon, removed                         with a cold snare. Resected and retrieved.                        - Non-bleeding internal hemorrhoids.                        - The examination was otherwise normal on direct and                         retroflexion views. Recommendation:        - Patient has a contact number available for                         emergencies. The signs and symptoms of potential                         delayed complications were discussed with the patient.                         Return to normal activities tomorrow.  Written                         discharge instructions were provided to the patient.                        - Discharge patient to home.                        - Resume previous diet.                        - Continue present medications.                        - Await pathology results.                        - Repeat colonoscopy for surveillance based on                         pathology results.                        - Return to referring physician as previously  scheduled.                        - The findings and recommendations were discussed with                         the patient. Procedure Code(s):     --- Professional ---                        [phone], Colonoscopy, flexible; with removal of                         tumor(s), polyp(s), or other lesion(s) by snare                         technique Diagnosis Code(s):     --- Professional ---                        Z86.010, Personal history of colonic polyps                        K64.0, First degree hemorrhoids                        D12.2, Benign neoplasm of ascending colon CPT copyright 2022 American [employer] Association. All rights reserved. The codes documented in this report are preliminary and upon coder review may  be revised to meet current compliance requirements. Attending Participation:      I personally performed the entire procedure. Elfredia Nevins, DO Jaynie Collins DO, DO 07/21/2023 8:51:34 AM This report has been signed electronically. Number of Addenda: 0 Note Initiated On: 07/21/2023 8:20 AM Estimated Blood Loss:  Estimated blood loss was minimal.      [facility]

## 2023-07-22 ENCOUNTER — Encounter: Payer: Self-pay | Admitting: Gastroenterology

## 2024-01-02 DIAGNOSIS — R7303 Prediabetes: Secondary | ICD-10-CM | POA: Insufficient documentation

## 2024-04-19 ENCOUNTER — Encounter (HOSPITAL_COMMUNITY): Payer: Self-pay

## 2024-04-26 ENCOUNTER — Ambulatory Visit (INDEPENDENT_AMBULATORY_CARE_PROVIDER_SITE_OTHER): Admitting: Physician Assistant

## 2024-04-26 ENCOUNTER — Encounter: Payer: Self-pay | Admitting: Physician Assistant

## 2024-04-26 VITALS — BP 122/76 | HR 70 | Temp 98.4°F | Ht 72.0 in | Wt 164.4 lb

## 2024-04-26 DIAGNOSIS — Z7689 Persons encountering health services in other specified circumstances: Secondary | ICD-10-CM

## 2024-04-26 DIAGNOSIS — Z0000 Encounter for general adult medical examination without abnormal findings: Secondary | ICD-10-CM

## 2024-04-26 DIAGNOSIS — Z13220 Encounter for screening for lipoid disorders: Secondary | ICD-10-CM

## 2024-04-26 NOTE — Progress Notes (Signed)
 Complete physical exam  Patient: Alex Mayer   DOB: 18-Dec-1980   43 y.o. Male  MRN: [id number]  Subjective:     No chief complaint on file.   Alex Mayer is a 43 y.o. male who presents today for a complete physical exam. He reports consuming a general diet. Gym/ health club routine includes mod to heavy weightlifting. He generally feels well. He reports sleeping well. He does not have additional problems to discuss today.    Most recent fall risk assessment:    04/26/2024   10:03 AM  Fall Risk   Falls in the past year? 0     Most recent depression screenings:    04/26/2024    9:54 AM  PHQ 2/9 Scores  PHQ - 2 Score 2  PHQ- 9 Score 6    Vision:Within last year and Dental: No current dental problems and No regular dental care   Patient Care Team: [facility] as PCP - General   Outpatient Medications Prior to Visit  Medication Sig   CREATINE MONOHYDRATE PO Take by mouth.   SYRINGE-NEEDLE, DISP, 3 ML (B-D 3CC LUER-LOK SYR 23GX1") 23G X 1" 3 ML MISC See admin instructions.   testosterone cypionate (DEPOTESTOSTERONE CYPIONATE) 200 MG/ML injection Inject into the muscle. Inject 0.85 mLs into the muscle once a week   [DISCONTINUED] OVER THE COUNTER MEDICATION Ibutamoren 25 mg daily (Patient not taking: Reported on 07/21/2023)   [DISCONTINUED] tamsulosin  (FLOMAX ) 0.4 MG CAPS capsule Take 1 capsule (0.4 mg total) by mouth daily. (Patient not taking: Reported on 07/21/2023)   No facility-administered medications prior to visit.    Review of Systems  Constitutional:  Negative for chills, fever and malaise/fatigue.  Eyes:  Negative for blurred vision and double vision.  Respiratory:  Negative for cough and shortness of breath.   Cardiovascular:  Negative for chest pain and palpitations.  Musculoskeletal:  Negative for joint pain and myalgias.  Neurological:  Negative for dizziness and headaches.  Psychiatric/Behavioral:  Negative for depression. The  patient is not nervous/anxious.           Objective:     BP 122/76   Pulse 70   Temp 98.4 F (36.9 C)   Ht 6' (1.829 m)   Wt 164 lb 6.4 oz (74.6 kg)   SpO2 98%   BMI 22.30 kg/m   Physical Exam Constitutional:      Appearance: Normal appearance.  HENT:     Head: Normocephalic.     Mouth/Throat:     Mouth: Mucous membranes are moist.     Pharynx: Oropharynx is clear.  Eyes:     Extraocular Movements: Extraocular movements intact.     Conjunctiva/sclera: Conjunctivae normal.  Cardiovascular:     Rate and Rhythm: Normal rate and regular rhythm.     Heart sounds: Normal heart sounds. No murmur heard.    No gallop.  Pulmonary:     Effort: Pulmonary effort is normal.     Breath sounds: Normal breath sounds. No wheezing, rhonchi or rales.  Musculoskeletal:     Right lower leg: No edema.     Left lower leg: No edema.  Skin:    General: Skin is warm and dry.  Neurological:     General: No focal deficit present.     Mental Status: He is alert and oriented to person, place, and time.  Psychiatric:        Mood and Affect: Mood normal.  Behavior: Behavior normal.      No results found for any visits on 04/26/24.    Assessment & Plan:    Routine Health Maintenance and Physical Exam  Health Maintenance  Topic Date Due   HIV Screening  Never done   Hepatitis C Screening  Never done   DTaP/Tdap/Td vaccine (1 - Tdap) Never done   COVID-19 Vaccine (1 - 2024-25 season) Never done   Flu Shot  07/09/2024   Colon Cancer Screening  07/20/2028   HPV Vaccine  Aged Out   Meningitis B Vaccine  Aged Out    Discussed health benefits of physical activity, and encouraged him to engage in regular exercise appropriate for his age and condition.  Problem List Items Addressed This Visit   None Visit Diagnoses       Encounter to establish care    -  Primary     Annual visit for general adult medical examination without abnormal findings       Relevant Orders    CMP14+EGFR   CBC with Differential/Platelet     Screening for lipid disorders       Relevant Orders   Lipid panel      Adult wellness-complete.wellness physical was conducted today. Importance of diet and exercise were discussed in detail.  Importance of stress reduction and healthy living were discussed.  In addition to this a discussion regarding safety was also covered.  We also reviewed over immunizations and gave recommendations regarding current immunization needed for age.   In addition to this additional areas were also touched on including: routine follow up with [facility] for management of testosterone Preventative health exams needed: none   Colonoscopy due in 2029  Patient was advised yearly wellness exam   Return in about 1 year (around 04/26/2025).     Jearlean Mince Daliah Chaudoin, PA-C

## 2024-04-27 ENCOUNTER — Ambulatory Visit: Payer: Self-pay | Admitting: Physician Assistant

## 2024-04-27 LAB — CMP14+EGFR
ALT: 24 IU/L (ref 0–44)
AST: 25 IU/L (ref 0–40)
Albumin: 4.5 g/dL (ref 4.1–5.1)
Alkaline Phosphatase: 78 IU/L (ref 44–121)
BUN/Creatinine Ratio: 9 (ref 9–20)
BUN: 13 mg/dL (ref 6–24)
Bilirubin Total: 0.4 mg/dL (ref 0.0–1.2)
CO2: 26 mmol/L (ref 20–29)
Calcium: 9.7 mg/dL (ref 8.7–10.2)
Chloride: 101 mmol/L (ref 96–106)
Creatinine, Ser: 1.5 mg/dL — ABNORMAL HIGH (ref 0.76–1.27)
Globulin, Total: 2.5 g/dL (ref 1.5–4.5)
Glucose: 62 mg/dL — ABNORMAL LOW (ref 70–99)
Potassium: 5 mmol/L (ref 3.5–5.2)
Sodium: 141 mmol/L (ref 134–144)
Total Protein: 7 g/dL (ref 6.0–8.5)
eGFR: 60 mL/min/{1.73_m2} (ref >59)

## 2024-04-27 LAB — LIPID PANEL
Chol/HDL Ratio: 3.7 ratio (ref 0.0–5.0)
Cholesterol, Total: 180 mg/dL (ref 100–199)
HDL: 49 mg/dL (ref >39)
LDL Chol Calc (NIH): 109 mg/dL — ABNORMAL HIGH (ref 0–99)
Triglycerides: 123 mg/dL (ref 0–149)
VLDL Cholesterol Cal: 22 mg/dL (ref 5–40)

## 2024-04-27 LAB — CBC WITH DIFFERENTIAL/PLATELET
Basophils Absolute: 0 10*3/uL (ref 0.0–0.2)
Basos: 1 %
EOS (ABSOLUTE): 0.1 10*3/uL (ref 0.0–0.4)
Eos: 2 %
Hematocrit: 53.4 % — ABNORMAL HIGH (ref 37.5–51.0)
Hemoglobin: 17.6 g/dL (ref 13.0–17.7)
Immature Grans (Abs): 0 10*3/uL (ref 0.0–0.1)
Immature Granulocytes: 0 %
Lymphocytes Absolute: 1.3 10*3/uL (ref 0.7–3.1)
Lymphs: 29 %
MCH: 30.8 pg (ref 26.6–33.0)
MCHC: 33 g/dL (ref 31.5–35.7)
MCV: 94 fL (ref 79–97)
Monocytes Absolute: 0.6 10*3/uL (ref 0.1–0.9)
Monocytes: 14 %
Neutrophils Absolute: 2.5 10*3/uL (ref 1.4–7.0)
Neutrophils: 54 %
Platelets: 249 10*3/uL (ref 150–450)
RBC: 5.71 x10E6/uL (ref 4.14–5.80)
RDW: 12 % (ref 11.6–15.4)
WBC: 4.6 10*3/uL (ref 3.4–10.8)
